# Patient Record
Sex: Female | Born: 1960 | Race: White | Hispanic: No | Smoking: Current every day smoker
Health system: Southern US, Community
[De-identification: ages and names within clinical notes are randomized; demographics above are authoritative.]

## PROBLEM LIST (undated history)

## (undated) DIAGNOSIS — Z720 Tobacco use: Secondary | ICD-10-CM

## (undated) DIAGNOSIS — F32A Depression, unspecified: Secondary | ICD-10-CM

## (undated) DIAGNOSIS — G4700 Insomnia, unspecified: Secondary | ICD-10-CM

## (undated) DIAGNOSIS — Z8639 Personal history of other endocrine, nutritional and metabolic disease: Secondary | ICD-10-CM

## (undated) DIAGNOSIS — I2510 Atherosclerotic heart disease of native coronary artery without angina pectoris: Secondary | ICD-10-CM

## (undated) DIAGNOSIS — F329 Major depressive disorder, single episode, unspecified: Secondary | ICD-10-CM

## (undated) DIAGNOSIS — D696 Thrombocytopenia, unspecified: Secondary | ICD-10-CM

## (undated) DIAGNOSIS — E785 Hyperlipidemia, unspecified: Secondary | ICD-10-CM

## (undated) DIAGNOSIS — I5181 Takotsubo syndrome: Secondary | ICD-10-CM

## (undated) DIAGNOSIS — D751 Secondary polycythemia: Secondary | ICD-10-CM

## (undated) DIAGNOSIS — E669 Obesity, unspecified: Secondary | ICD-10-CM

## (undated) DIAGNOSIS — F4000 Agoraphobia, unspecified: Secondary | ICD-10-CM

## (undated) DIAGNOSIS — I10 Essential (primary) hypertension: Secondary | ICD-10-CM

## (undated) DIAGNOSIS — E876 Hypokalemia: Secondary | ICD-10-CM

## (undated) DIAGNOSIS — F419 Anxiety disorder, unspecified: Secondary | ICD-10-CM

## (undated) DIAGNOSIS — I639 Cerebral infarction, unspecified: Secondary | ICD-10-CM

## (undated) HISTORY — DX: Essential (primary) hypertension: I10

## (undated) HISTORY — DX: Atherosclerotic heart disease of native coronary artery without angina pectoris: I25.10

## (undated) HISTORY — DX: Takotsubo syndrome: I51.81

## (undated) HISTORY — DX: Hyperlipidemia, unspecified: E78.5

## (undated) HISTORY — DX: Anxiety disorder, unspecified: F41.9

## (undated) HISTORY — DX: Secondary polycythemia: D75.1

## (undated) HISTORY — DX: Obesity, unspecified: E66.9

## (undated) HISTORY — DX: Major depressive disorder, single episode, unspecified: F32.9

## (undated) HISTORY — DX: Cerebral infarction, unspecified: I63.9

## (undated) HISTORY — DX: Hypokalemia: E87.6

## (undated) HISTORY — DX: Personal history of other endocrine, nutritional and metabolic disease: Z86.39

## (undated) HISTORY — DX: Insomnia, unspecified: G47.00

## (undated) HISTORY — DX: Tobacco use: Z72.0

## (undated) HISTORY — DX: Agoraphobia, unspecified: F40.00

## (undated) HISTORY — DX: Thrombocytopenia, unspecified: D69.6

## (undated) HISTORY — DX: Depression, unspecified: F32.A

---

## 2012-01-04 ENCOUNTER — Emergency Department: Payer: Self-pay | Admitting: Emergency Medicine

## 2012-03-19 DIAGNOSIS — I639 Cerebral infarction, unspecified: Secondary | ICD-10-CM

## 2012-03-19 HISTORY — DX: Cerebral infarction, unspecified: I63.9

## 2012-04-03 ENCOUNTER — Inpatient Hospital Stay: Payer: Self-pay | Admitting: Internal Medicine

## 2012-04-03 LAB — CBC
HCT: 57.3 % — ABNORMAL HIGH (ref 35.0–47.0)
HGB: 19.8 g/dL — ABNORMAL HIGH (ref 12.0–16.0)
MCH: 32.5 pg (ref 26.0–34.0)
MCV: 94 fL (ref 80–100)
Platelet: 169 10*3/uL (ref 150–440)
RBC: 6.11 10*6/uL — ABNORMAL HIGH (ref 3.80–5.20)
RDW: 13.7 % (ref 11.5–14.5)

## 2012-04-03 LAB — TROPONIN I
Troponin-I: <0.02 ng/mL
Troponin-I: 0.06 ng/mL — ABNORMAL HIGH

## 2012-04-03 LAB — COMPREHENSIVE METABOLIC PANEL
Calcium, Total: 10 mg/dL (ref 8.5–10.1)
Chloride: 101 mmol/L (ref 98–107)
Co2: 24 mmol/L (ref 21–32)
Creatinine: 0.89 mg/dL (ref 0.60–1.30)
EGFR (African American): >60
EGFR (Non-African Amer.): >60
Osmolality: 288 (ref 275–301)
Potassium: 2.8 mmol/L — ABNORMAL LOW (ref 3.5–5.1)
SGPT (ALT): 17 U/L
Total Protein: 8.3 g/dL — ABNORMAL HIGH (ref 6.4–8.2)

## 2012-04-03 LAB — CK TOTAL AND CKMB (NOT AT ARMC)
CK, Total: 250 U/L — ABNORMAL HIGH (ref 21–215)
CK, Total: 742 U/L — ABNORMAL HIGH (ref 21–215)
CK-MB: 5.4 ng/mL — ABNORMAL HIGH (ref 0.5–3.6)

## 2012-04-03 LAB — HCG, QUANTITATIVE, PREGNANCY: Beta Hcg, Quant.: 1 m[IU]/mL

## 2012-04-04 LAB — BASIC METABOLIC PANEL
Anion Gap: 10 (ref 7–16)
BUN: 29 mg/dL — ABNORMAL HIGH (ref 7–18)
Calcium, Total: 9.4 mg/dL (ref 8.5–10.1)
Chloride: 99 mmol/L (ref 98–107)
Creatinine: 0.81 mg/dL (ref 0.60–1.30)
EGFR (Non-African Amer.): >60
Glucose: 145 mg/dL — ABNORMAL HIGH (ref 65–99)
Osmolality: 286 (ref 275–301)
Potassium: 2.8 mmol/L — ABNORMAL LOW (ref 3.5–5.1)

## 2012-04-04 LAB — TROPONIN I: Troponin-I: 0.04 ng/mL

## 2012-04-04 LAB — CBC WITH DIFFERENTIAL/PLATELET
Basophil %: 0.1 %
Eosinophil #: 0 10*3/uL (ref 0.0–0.7)
Eosinophil %: 0 %
HCT: 51.1 % — ABNORMAL HIGH (ref 35.0–47.0)
Lymphocyte #: 0.8 10*3/uL — ABNORMAL LOW (ref 1.0–3.6)
Lymphocyte %: 3.5 %
MCHC: 33.7 g/dL (ref 32.0–36.0)
Monocyte %: 5.3 %
Neutrophil %: 91.1 %
RBC: 5.38 10*6/uL — ABNORMAL HIGH (ref 3.80–5.20)
RDW: 14.1 % (ref 11.5–14.5)
WBC: 23.8 10*3/uL — ABNORMAL HIGH (ref 3.6–11.0)

## 2012-04-04 LAB — URINALYSIS, COMPLETE
Bacteria: NONE SEEN
Leukocyte Esterase: NEGATIVE
Nitrite: NEGATIVE
Ph: 7 (ref 4.5–8.0)
Protein: >=500
RBC,UR: 6 /HPF (ref 0–5)
Specific Gravity: >1.06 (ref 1.003–1.030)
WBC UR: 7 /HPF (ref 0–5)

## 2012-04-04 LAB — CK TOTAL AND CKMB (NOT AT ARMC): CK-MB: 4.8 ng/mL — ABNORMAL HIGH (ref 0.5–3.6)

## 2012-04-04 LAB — DRUG SCREEN, URINE
Amphetamines, Ur Screen: NEGATIVE (ref ?–1000)
Barbiturates, Ur Screen: NEGATIVE (ref ?–200)
Benzodiazepine, Ur Scrn: POSITIVE (ref ?–200)
Cannabinoid 50 Ng, Ur ~~LOC~~: NEGATIVE (ref ?–50)
Phencyclidine (PCP) Ur S: NEGATIVE (ref ?–25)
Tricyclic, Ur Screen: NEGATIVE (ref ?–1000)

## 2012-04-04 LAB — PREGNANCY, URINE: Pregnancy Test, Urine: NEGATIVE m[IU]/mL

## 2012-04-05 LAB — HEMOGLOBIN A1C: Hemoglobin A1C: 5 % (ref 4.2–6.3)

## 2012-04-05 LAB — BASIC METABOLIC PANEL
Anion Gap: 8 (ref 7–16)
BUN: 27 mg/dL — ABNORMAL HIGH (ref 7–18)
Calcium, Total: 9 mg/dL (ref 8.5–10.1)
Creatinine: 0.99 mg/dL (ref 0.60–1.30)
Glucose: 93 mg/dL (ref 65–99)
Osmolality: 282 (ref 275–301)

## 2012-04-05 LAB — CBC WITH DIFFERENTIAL/PLATELET
Basophil #: 0 10*3/uL (ref 0.0–0.1)
Basophil %: 0.3 %
Eosinophil #: 0 10*3/uL (ref 0.0–0.7)
HGB: 16.1 g/dL — ABNORMAL HIGH (ref 12.0–16.0)
Lymphocyte #: 1.2 10*3/uL (ref 1.0–3.6)
Lymphocyte %: 11.3 %
MCH: 32.1 pg (ref 26.0–34.0)
MCV: 98 fL (ref 80–100)
Neutrophil #: 8.4 10*3/uL — ABNORMAL HIGH (ref 1.4–6.5)
RBC: 5 10*6/uL (ref 3.80–5.20)
RDW: 14.2 % (ref 11.5–14.5)
WBC: 10.3 10*3/uL (ref 3.6–11.0)

## 2012-04-05 LAB — MAGNESIUM: Magnesium: 2.2 mg/dL

## 2012-04-06 ENCOUNTER — Observation Stay: Payer: Self-pay | Admitting: Internal Medicine

## 2012-04-06 LAB — SEDIMENTATION RATE: Erythrocyte Sed Rate: 1 mm/hr (ref 0–20)

## 2012-04-06 LAB — CBC
HCT: 49.7 % — ABNORMAL HIGH (ref 35.0–47.0)
MCH: 31.8 pg (ref 26.0–34.0)
MCV: 98 fL (ref 80–100)
Platelet: 145 10*3/uL — ABNORMAL LOW (ref 150–440)
RDW: 13.8 % (ref 11.5–14.5)
WBC: 12.4 10*3/uL — ABNORMAL HIGH (ref 3.6–11.0)

## 2012-04-06 LAB — COMPREHENSIVE METABOLIC PANEL
Alkaline Phosphatase: 68 U/L (ref 50–136)
Anion Gap: 13 (ref 7–16)
Bilirubin,Total: 0.8 mg/dL (ref 0.2–1.0)
Chloride: 103 mmol/L (ref 98–107)
EGFR (African American): >60
Glucose: 95 mg/dL (ref 65–99)
Potassium: 3.7 mmol/L (ref 3.5–5.1)
SGPT (ALT): 24 U/L
Sodium: 139 mmol/L (ref 136–145)

## 2012-04-06 LAB — TROPONIN I: Troponin-I: <0.02 ng/mL

## 2012-04-07 LAB — URINALYSIS, COMPLETE
Blood: NEGATIVE
Leukocyte Esterase: NEGATIVE
Ph: 6 (ref 4.5–8.0)
Protein: NEGATIVE
Specific Gravity: 1.015 (ref 1.003–1.030)
Squamous Epithelial: 3

## 2012-04-07 LAB — CBC WITH DIFFERENTIAL/PLATELET
Basophil #: 0 10*3/uL (ref 0.0–0.1)
Basophil %: 0.5 %
HGB: 15.6 g/dL (ref 12.0–16.0)
Lymphocyte #: 1.6 10*3/uL (ref 1.0–3.6)
Lymphocyte %: 22.9 %
Monocyte #: 0.4 x10 3/mm (ref 0.2–0.9)
Monocyte %: 6 %
Neutrophil #: 4.9 10*3/uL (ref 1.4–6.5)
Platelet: 101 10*3/uL — ABNORMAL LOW (ref 150–440)
RBC: 4.75 10*6/uL (ref 3.80–5.20)
RDW: 13.7 % (ref 11.5–14.5)
WBC: 7.1 10*3/uL (ref 3.6–11.0)

## 2012-04-08 LAB — SEDIMENTATION RATE: Erythrocyte Sed Rate: 1 mm/hr (ref 0–20)

## 2012-05-04 LAB — COMPREHENSIVE METABOLIC PANEL
Anion Gap: 14 (ref 7–16)
Bilirubin,Total: 0.8 mg/dL (ref 0.2–1.0)
Calcium, Total: 9.5 mg/dL (ref 8.5–10.1)
Chloride: 106 mmol/L (ref 98–107)
Co2: 21 mmol/L (ref 21–32)
EGFR (African American): >60
EGFR (Non-African Amer.): >60
Glucose: 131 mg/dL — ABNORMAL HIGH (ref 65–99)
Osmolality: 288 (ref 275–301)
SGOT(AST): 25 U/L (ref 15–37)
SGPT (ALT): 18 U/L
Sodium: 141 mmol/L (ref 136–145)
Total Protein: 7.6 g/dL (ref 6.4–8.2)

## 2012-05-04 LAB — PREGNANCY, URINE: Pregnancy Test, Urine: NEGATIVE m[IU]/mL

## 2012-05-04 LAB — URINALYSIS, COMPLETE
Glucose,UR: NEGATIVE mg/dL (ref 0–75)
Protein: NEGATIVE
RBC,UR: 5 /HPF (ref 0–5)
WBC UR: 4 /HPF (ref 0–5)

## 2012-05-04 LAB — CBC
HCT: 53.7 % — ABNORMAL HIGH (ref 35.0–47.0)
MCHC: 32.3 g/dL (ref 32.0–36.0)
RBC: 5.47 10*6/uL — ABNORMAL HIGH (ref 3.80–5.20)
RDW: 13.2 % (ref 11.5–14.5)
WBC: 11.9 10*3/uL — ABNORMAL HIGH (ref 3.6–11.0)

## 2012-05-04 LAB — LIPASE, BLOOD: Lipase: 171 U/L (ref 73–393)

## 2012-05-05 ENCOUNTER — Inpatient Hospital Stay: Payer: Self-pay | Admitting: Internal Medicine

## 2012-05-05 LAB — TROPONIN I: Troponin-I: <0.02 ng/mL

## 2012-05-05 LAB — TSH: Thyroid Stimulating Horm: 1.24 u[IU]/mL

## 2012-05-05 LAB — CK TOTAL AND CKMB (NOT AT ARMC)
CK, Total: 265 U/L — ABNORMAL HIGH (ref 21–215)
CK, Total: 344 U/L — ABNORMAL HIGH (ref 21–215)
CK-MB: 5.4 ng/mL — ABNORMAL HIGH (ref 0.5–3.6)

## 2012-05-06 LAB — CBC WITH DIFFERENTIAL/PLATELET
Basophil #: 0 10*3/uL (ref 0.0–0.1)
Eosinophil #: 0.1 10*3/uL (ref 0.0–0.7)
HGB: 15.8 g/dL (ref 12.0–16.0)
Lymphocyte #: 1.1 10*3/uL (ref 1.0–3.6)
MCH: 32.6 pg (ref 26.0–34.0)
MCHC: 33.9 g/dL (ref 32.0–36.0)
Monocyte #: 0.5 x10 3/mm (ref 0.2–0.9)
Neutrophil %: 76.4 %
Platelet: 132 10*3/uL — ABNORMAL LOW (ref 150–440)
RDW: 13.3 % (ref 11.5–14.5)

## 2012-05-06 LAB — BASIC METABOLIC PANEL
Calcium, Total: 9 mg/dL (ref 8.5–10.1)
Co2: 24 mmol/L (ref 21–32)
EGFR (African American): >60
Potassium: 3.1 mmol/L — ABNORMAL LOW (ref 3.5–5.1)
Sodium: 138 mmol/L (ref 136–145)

## 2012-05-11 ENCOUNTER — Emergency Department: Payer: Self-pay | Admitting: Emergency Medicine

## 2012-05-11 LAB — BASIC METABOLIC PANEL
Anion Gap: 9 (ref 7–16)
BUN: 6 mg/dL — ABNORMAL LOW (ref 7–18)
Calcium, Total: 9.6 mg/dL (ref 8.5–10.1)
Chloride: 101 mmol/L (ref 98–107)
Co2: 24 mmol/L (ref 21–32)
EGFR (African American): 34 — ABNORMAL LOW
EGFR (Non-African Amer.): 30 — ABNORMAL LOW
Glucose: 90 mg/dL (ref 65–99)
Osmolality: 265 (ref 275–301)
Potassium: 3.4 mmol/L — ABNORMAL LOW (ref 3.5–5.1)

## 2012-05-11 LAB — CBC WITH DIFFERENTIAL/PLATELET
Basophil #: 0.1 10*3/uL (ref 0.0–0.1)
Eosinophil #: 0.1 10*3/uL (ref 0.0–0.7)
Eosinophil %: 0.6 %
HGB: 15.5 g/dL (ref 12.0–16.0)
Lymphocyte #: 1.9 10*3/uL (ref 1.0–3.6)
MCV: 95 fL (ref 80–100)
Monocyte %: 5.5 %
Neutrophil #: 7.3 10*3/uL — ABNORMAL HIGH (ref 1.4–6.5)
Neutrophil %: 74.4 %
Platelet: 173 10*3/uL (ref 150–440)
RBC: 4.82 10*6/uL (ref 3.80–5.20)
RDW: 12.7 % (ref 11.5–14.5)
WBC: 9.9 10*3/uL (ref 3.6–11.0)

## 2012-07-26 ENCOUNTER — Emergency Department: Payer: Self-pay | Admitting: Emergency Medicine

## 2012-07-26 LAB — COMPREHENSIVE METABOLIC PANEL
Albumin: 4.1 g/dL (ref 3.4–5.0)
Bilirubin,Total: 0.9 mg/dL (ref 0.2–1.0)
Calcium, Total: 9.4 mg/dL (ref 8.5–10.1)
Co2: 23 mmol/L (ref 21–32)
Creatinine: 0.84 mg/dL (ref 0.60–1.30)
EGFR (Non-African Amer.): >60
Glucose: 141 mg/dL — ABNORMAL HIGH (ref 65–99)
Osmolality: 283 (ref 275–301)
Potassium: 3.3 mmol/L — ABNORMAL LOW (ref 3.5–5.1)
SGOT(AST): 21 U/L (ref 15–37)
Sodium: 141 mmol/L (ref 136–145)

## 2012-07-26 LAB — URINALYSIS, COMPLETE
Bilirubin,UR: NEGATIVE
Ph: 7 (ref 4.5–8.0)
Protein: >=500
RBC,UR: 20 /HPF (ref 0–5)
Specific Gravity: 1.018 (ref 1.003–1.030)
WBC UR: 8 /HPF (ref 0–5)

## 2012-07-26 LAB — CBC
HCT: 52.4 % — ABNORMAL HIGH (ref 35.0–47.0)
HGB: 18.3 g/dL — ABNORMAL HIGH (ref 12.0–16.0)
MCHC: 35 g/dL (ref 32.0–36.0)
RBC: 5.54 10*6/uL — ABNORMAL HIGH (ref 3.80–5.20)
WBC: 13.1 10*3/uL — ABNORMAL HIGH (ref 3.6–11.0)

## 2012-07-26 LAB — LIPASE, BLOOD: Lipase: 199 U/L (ref 73–393)

## 2012-09-18 HISTORY — PX: CARDIAC CATHETERIZATION: SHX172

## 2012-09-21 ENCOUNTER — Inpatient Hospital Stay: Payer: Self-pay | Admitting: Internal Medicine

## 2012-09-21 LAB — CBC WITH DIFFERENTIAL/PLATELET
Basophil #: 0.5 10*3/uL — ABNORMAL HIGH (ref 0.0–0.1)
Basophil %: 0.3 %
Eosinophil %: 1.4 %
Eosinophil %: 0.1 %
HCT: 51.7 % — ABNORMAL HIGH (ref 35.0–47.0)
HGB: 17.8 g/dL — ABNORMAL HIGH (ref 12.0–16.0)
Lymphocyte #: 0.8 10*3/uL — ABNORMAL LOW (ref 1.0–3.6)
Lymphocyte #: 1 10*3/uL (ref 1.0–3.6)
MCH: 32.4 pg (ref 26.0–34.0)
MCH: 32.6 pg (ref 26.0–34.0)
MCHC: 34.4 g/dL (ref 32.0–36.0)
MCV: 94 fL (ref 80–100)
MCV: 95 fL (ref 80–100)
Monocyte #: 0.5 x10 3/mm (ref 0.2–0.9)
Monocyte #: 0.6 x10 3/mm (ref 0.2–0.9)
Neutrophil #: 9.7 10*3/uL — ABNORMAL HIGH (ref 1.4–6.5)
Neutrophil #: 11.8 10*3/uL — ABNORMAL HIGH (ref 1.4–6.5)
Neutrophil %: 84.1 %
Neutrophil %: 88 %
Platelet: 206 10*3/uL (ref 150–440)
RBC: 5.09 10*6/uL (ref 3.80–5.20)
RBC: 5.45 10*6/uL — ABNORMAL HIGH (ref 3.80–5.20)
RDW: 13.4 % (ref 11.5–14.5)
WBC: 11.5 10*3/uL — ABNORMAL HIGH (ref 3.6–11.0)
WBC: 13.4 10*3/uL — ABNORMAL HIGH (ref 3.6–11.0)

## 2012-09-21 LAB — DRUG SCREEN, URINE
Barbiturates, Ur Screen: NEGATIVE (ref ?–200)
Cocaine Metabolite,Ur ~~LOC~~: NEGATIVE (ref ?–300)
MDMA (Ecstasy)Ur Screen: NEGATIVE (ref ?–500)
Opiate, Ur Screen: POSITIVE (ref ?–300)
Phencyclidine (PCP) Ur S: NEGATIVE (ref ?–25)
Tricyclic, Ur Screen: NEGATIVE (ref ?–1000)

## 2012-09-21 LAB — URINALYSIS, COMPLETE
Bacteria: NONE SEEN
Bilirubin,UR: NEGATIVE
Blood: NEGATIVE
Glucose,UR: 50 mg/dL (ref 0–75)
Leukocyte Esterase: NEGATIVE
Ph: 7 (ref 4.5–8.0)
Specific Gravity: 1.015 (ref 1.003–1.030)
WBC UR: 1 /HPF (ref 0–5)

## 2012-09-21 LAB — CK-MB: CK-MB: 19.6 ng/mL — ABNORMAL HIGH (ref 0.5–3.6)

## 2012-09-21 LAB — CK TOTAL AND CKMB (NOT AT ARMC)
CK, Total: 422 U/L — ABNORMAL HIGH (ref 21–215)
CK-MB: 36.7 ng/mL — ABNORMAL HIGH (ref 0.5–3.6)

## 2012-09-21 LAB — COMPREHENSIVE METABOLIC PANEL
Albumin: 3.9 g/dL (ref 3.4–5.0)
Alkaline Phosphatase: 75 U/L (ref 50–136)
Calcium, Total: 9.5 mg/dL (ref 8.5–10.1)
Co2: 20 mmol/L — ABNORMAL LOW (ref 21–32)
Glucose: 161 mg/dL — ABNORMAL HIGH (ref 65–99)
Potassium: 4.8 mmol/L (ref 3.5–5.1)
SGOT(AST): 37 U/L (ref 15–37)
SGPT (ALT): 17 U/L (ref 12–78)

## 2012-09-21 LAB — PREGNANCY, URINE: Pregnancy Test, Urine: NEGATIVE m[IU]/mL

## 2012-09-21 LAB — APTT: Activated PTT: 31.3 secs (ref 23.6–35.9)

## 2012-09-22 DIAGNOSIS — I517 Cardiomegaly: Secondary | ICD-10-CM

## 2012-09-22 DIAGNOSIS — I2510 Atherosclerotic heart disease of native coronary artery without angina pectoris: Secondary | ICD-10-CM

## 2012-09-22 LAB — CBC WITH DIFFERENTIAL/PLATELET
Basophil %: 0.4 %
Eosinophil %: 0.1 %
HCT: 48.8 % — ABNORMAL HIGH (ref 35.0–47.0)
HGB: 17 g/dL — ABNORMAL HIGH (ref 12.0–16.0)
Lymphocyte #: 1.6 10*3/uL (ref 1.0–3.6)
MCH: 32.9 pg (ref 26.0–34.0)
MCHC: 34.9 g/dL (ref 32.0–36.0)
MCV: 94 fL (ref 80–100)
Monocyte #: 0.8 x10 3/mm (ref 0.2–0.9)
Neutrophil #: 10.6 10*3/uL — ABNORMAL HIGH (ref 1.4–6.5)
Neutrophil %: 81.4 %
Platelet: 230 10*3/uL (ref 150–440)
RBC: 5.17 10*6/uL (ref 3.80–5.20)

## 2012-09-22 LAB — APTT: Activated PTT: 65.1 secs — ABNORMAL HIGH (ref 23.6–35.9)

## 2012-09-22 LAB — COMPREHENSIVE METABOLIC PANEL
Albumin: 3.7 g/dL (ref 3.4–5.0)
Anion Gap: 9 (ref 7–16)
BUN: 23 mg/dL — ABNORMAL HIGH (ref 7–18)
Bilirubin,Total: 0.6 mg/dL (ref 0.2–1.0)
Chloride: 105 mmol/L (ref 98–107)
Creatinine: 0.9 mg/dL (ref 0.60–1.30)
EGFR (African American): >60
EGFR (Non-African Amer.): >60
Glucose: 110 mg/dL — ABNORMAL HIGH (ref 65–99)
Osmolality: 280 (ref 275–301)
Potassium: 3.1 mmol/L — ABNORMAL LOW (ref 3.5–5.1)
SGOT(AST): 50 U/L — ABNORMAL HIGH (ref 15–37)
SGPT (ALT): 18 U/L (ref 12–78)
Total Protein: 7.4 g/dL (ref 6.4–8.2)

## 2012-09-22 LAB — LIPASE, BLOOD: Lipase: 223 U/L (ref 73–393)

## 2012-09-22 LAB — LIPID PANEL
Cholesterol: 211 mg/dL — ABNORMAL HIGH (ref 0–200)
HDL Cholesterol: 61 mg/dL — ABNORMAL HIGH (ref 40–60)
Ldl Cholesterol, Calc: 124 mg/dL — ABNORMAL HIGH (ref 0–100)

## 2012-09-22 LAB — CK TOTAL AND CKMB (NOT AT ARMC)
CK, Total: 348 U/L — ABNORMAL HIGH (ref 21–215)
CK-MB: 25.7 ng/mL — ABNORMAL HIGH (ref 0.5–3.6)

## 2012-09-22 LAB — MAGNESIUM: Magnesium: 2.3 mg/dL

## 2012-09-23 ENCOUNTER — Telehealth: Payer: Self-pay

## 2012-09-23 ENCOUNTER — Encounter: Payer: Self-pay | Admitting: *Deleted

## 2012-09-23 ENCOUNTER — Other Ambulatory Visit: Payer: Self-pay | Admitting: Cardiovascular Disease

## 2012-09-23 DIAGNOSIS — I214 Non-ST elevation (NSTEMI) myocardial infarction: Secondary | ICD-10-CM

## 2012-09-23 NOTE — Telephone Encounter (Signed)
Message copied by [facility], Tremayne Sheldon E on Fri Sep 23, 2012  2:00 PM ------      Message from: Kendrick Fries      Created: Fri Sep 23, 2012 11:40 AM       Tcm      Discharged from [facility] 09/23/2012.      F/u with [facility] 09/29/12.

## 2012-09-26 NOTE — Telephone Encounter (Signed)
TCM attempt #1 "number you are trying to call is not reachable"

## 2012-09-27 NOTE — Telephone Encounter (Signed)
TCM attempt #2 "not reachable"

## 2012-09-29 ENCOUNTER — Encounter: Payer: Self-pay | Admitting: Cardiovascular Disease

## 2012-10-02 ENCOUNTER — Emergency Department: Payer: Self-pay | Admitting: Emergency Medicine

## 2012-10-02 LAB — COMPREHENSIVE METABOLIC PANEL
Albumin: 4.5 g/dL (ref 3.4–5.0)
Alkaline Phosphatase: 84 U/L (ref 50–136)
BUN: 34 mg/dL — ABNORMAL HIGH (ref 7–18)
Bilirubin,Total: 0.6 mg/dL (ref 0.2–1.0)
Co2: 23 mmol/L (ref 21–32)
Creatinine: 1.21 mg/dL (ref 0.60–1.30)
EGFR (Non-African Amer.): 52 — ABNORMAL LOW
Glucose: 162 mg/dL — ABNORMAL HIGH (ref 65–99)
SGPT (ALT): 25 U/L (ref 12–78)
Total Protein: 8.5 g/dL — ABNORMAL HIGH (ref 6.4–8.2)

## 2012-10-02 LAB — URINALYSIS, COMPLETE
Bacteria: NONE SEEN
Glucose,UR: 50 mg/dL (ref 0–75)
Hyaline Cast: 3
Nitrite: NEGATIVE
Specific Gravity: 1.022 (ref 1.003–1.030)
WBC UR: 2 /HPF (ref 0–5)

## 2012-10-02 LAB — TROPONIN I
Troponin-I: 0.21 ng/mL — ABNORMAL HIGH
Troponin-I: 0.37 ng/mL — ABNORMAL HIGH

## 2012-10-02 LAB — CBC
HCT: 56.2 % — ABNORMAL HIGH
HGB: 19.3 g/dL — ABNORMAL HIGH
MCH: 32.3 pg
MCHC: 34.3 g/dL
MCV: 94 fL
Platelet: 272 10*3/uL
RBC: 5.98 X10 6/mm 3 — ABNORMAL HIGH
RDW: 13.6 %
WBC: 17.7 10*3/uL — ABNORMAL HIGH

## 2012-10-21 ENCOUNTER — Inpatient Hospital Stay: Payer: Self-pay | Admitting: Family Medicine

## 2012-10-21 LAB — DRUG SCREEN, URINE
Amphetamines, Ur Screen: NEGATIVE (ref ?–1000)
Barbiturates, Ur Screen: NEGATIVE (ref ?–200)
Cannabinoid 50 Ng, Ur ~~LOC~~: NEGATIVE (ref ?–50)
Cocaine Metabolite,Ur ~~LOC~~: NEGATIVE (ref ?–300)
MDMA (Ecstasy)Ur Screen: NEGATIVE (ref ?–500)
Opiate, Ur Screen: NEGATIVE (ref ?–300)
Phencyclidine (PCP) Ur S: NEGATIVE (ref ?–25)
Tricyclic, Ur Screen: NEGATIVE (ref ?–1000)

## 2012-10-21 LAB — COMPREHENSIVE METABOLIC PANEL
Albumin: 4.7 g/dL (ref 3.4–5.0)
Anion Gap: 17 — ABNORMAL HIGH (ref 7–16)
BUN: 29 mg/dL — ABNORMAL HIGH (ref 7–18)
Bilirubin,Total: 1.1 mg/dL — ABNORMAL HIGH (ref 0.2–1.0)
Co2: 26 mmol/L (ref 21–32)
Creatinine: 1.65 mg/dL — ABNORMAL HIGH (ref 0.60–1.30)
Glucose: 176 mg/dL — ABNORMAL HIGH (ref 65–99)
Osmolality: 290 (ref 275–301)
Potassium: 3 mmol/L — ABNORMAL LOW (ref 3.5–5.1)
SGOT(AST): 46 U/L — ABNORMAL HIGH (ref 15–37)
SGPT (ALT): 19 U/L (ref 12–78)
Total Protein: 9 g/dL — ABNORMAL HIGH (ref 6.4–8.2)

## 2012-10-21 LAB — CBC
HCT: 58.2 % — ABNORMAL HIGH (ref 35.0–47.0)
HGB: 20.3 g/dL — ABNORMAL HIGH (ref 12.0–16.0)
MCHC: 34.8 g/dL (ref 32.0–36.0)
MCV: 93 fL (ref 80–100)
Platelet: 253 10*3/uL (ref 150–440)
RBC: 6.29 10*6/uL — ABNORMAL HIGH (ref 3.80–5.20)
RDW: 13.6 % (ref 11.5–14.5)
WBC: 25.8 10*3/uL — ABNORMAL HIGH (ref 3.6–11.0)

## 2012-10-21 LAB — LIPASE, BLOOD: Lipase: 179 U/L (ref 73–393)

## 2012-10-21 LAB — URINALYSIS, COMPLETE
Glucose,UR: 50 mg/dL (ref 0–75)
Nitrite: NEGATIVE
Protein: >=500
RBC,UR: 5 /HPF (ref 0–5)
Specific Gravity: 1.032 (ref 1.003–1.030)
Squamous Epithelial: 8

## 2012-10-21 LAB — RAPID INFLUENZA A&B ANTIGENS

## 2012-10-21 LAB — TROPONIN I: Troponin-I: 8.09 ng/mL — ABNORMAL HIGH

## 2012-10-21 LAB — ETHANOL
Ethanol %: <0.003 % (ref 0.000–0.080)
Ethanol: <3 mg/dL

## 2012-10-21 LAB — MAGNESIUM: Magnesium: 2 mg/dL

## 2012-10-21 LAB — APTT: Activated PTT: 28.1 secs (ref 23.6–35.9)

## 2012-10-21 LAB — PROTIME-INR: Prothrombin Time: 12 secs (ref 11.5–14.7)

## 2012-10-21 LAB — CK TOTAL AND CKMB (NOT AT ARMC): CK-MB: 22.8 ng/mL — ABNORMAL HIGH (ref 0.5–3.6)

## 2012-10-22 DIAGNOSIS — I214 Non-ST elevation (NSTEMI) myocardial infarction: Secondary | ICD-10-CM

## 2012-10-22 LAB — CBC WITH DIFFERENTIAL/PLATELET
Basophil #: 0.1 10*3/uL (ref 0.0–0.1)
Basophil %: 0.6 %
Eosinophil %: 0 %
HCT: 46.4 % (ref 35.0–47.0)
HGB: 16.2 g/dL — ABNORMAL HIGH (ref 12.0–16.0)
Lymphocyte #: 1.6 10*3/uL (ref 1.0–3.6)
Lymphocyte %: 10.1 %
MCH: 32.3 pg (ref 26.0–34.0)
MCHC: 34.9 g/dL (ref 32.0–36.0)
Monocyte #: 1.1 x10 3/mm — ABNORMAL HIGH (ref 0.2–0.9)
Neutrophil #: 13.4 10*3/uL — ABNORMAL HIGH (ref 1.4–6.5)
Neutrophil %: 82.6 %
Platelet: 163 10*3/uL (ref 150–440)
RBC: 5.01 10*6/uL (ref 3.80–5.20)
RDW: 13.6 % (ref 11.5–14.5)
WBC: 16.2 10*3/uL — ABNORMAL HIGH (ref 3.6–11.0)

## 2012-10-22 LAB — BASIC METABOLIC PANEL
Anion Gap: 10 (ref 7–16)
BUN: 33 mg/dL — ABNORMAL HIGH (ref 7–18)
Calcium, Total: 8.7 mg/dL (ref 8.5–10.1)
Chloride: 104 mmol/L (ref 98–107)
Co2: 30 mmol/L (ref 21–32)
EGFR (African American): 51 — ABNORMAL LOW
EGFR (Non-African Amer.): 44 — ABNORMAL LOW
Glucose: 116 mg/dL — ABNORMAL HIGH (ref 65–99)
Osmolality: 295 (ref 275–301)
Potassium: 2.7 mmol/L — ABNORMAL LOW (ref 3.5–5.1)

## 2012-10-22 LAB — APTT: Activated PTT: 104.3 secs — ABNORMAL HIGH (ref 23.6–35.9)

## 2012-10-22 LAB — TROPONIN I: Troponin-I: 5.3 ng/mL — ABNORMAL HIGH

## 2012-10-22 LAB — POTASSIUM: Potassium: 3.2 mmol/L — ABNORMAL LOW (ref 3.5–5.1)

## 2012-10-23 DIAGNOSIS — I517 Cardiomegaly: Secondary | ICD-10-CM

## 2012-10-23 LAB — TROPONIN I: Troponin-I: 0.82 ng/mL — ABNORMAL HIGH

## 2012-10-23 LAB — CBC WITH DIFFERENTIAL/PLATELET
Eosinophil #: 0 10*3/uL (ref 0.0–0.7)
Eosinophil %: 0.3 %
HCT: 42 % (ref 35.0–47.0)
HGB: 14.3 g/dL (ref 12.0–16.0)
Lymphocyte #: 2 10*3/uL (ref 1.0–3.6)
Lymphocyte %: 19.3 %
MCV: 95 fL (ref 80–100)
Monocyte #: 0.5 x10 3/mm (ref 0.2–0.9)
Monocyte %: 5.3 %
Neutrophil %: 74 %
RBC: 4.44 10*6/uL (ref 3.80–5.20)
RDW: 13.5 % (ref 11.5–14.5)

## 2012-10-23 LAB — BASIC METABOLIC PANEL
Anion Gap: 9 (ref 7–16)
BUN: 21 mg/dL — ABNORMAL HIGH (ref 7–18)
Creatinine: 1.06 mg/dL (ref 0.60–1.30)
EGFR (Non-African Amer.): >60
Glucose: 89 mg/dL (ref 65–99)
Osmolality: 289 (ref 275–301)
Potassium: 3.1 mmol/L — ABNORMAL LOW (ref 3.5–5.1)
Sodium: 144 mmol/L (ref 136–145)

## 2012-10-23 LAB — APTT: Activated PTT: 80.3 secs — ABNORMAL HIGH (ref 23.6–35.9)

## 2012-10-24 DIAGNOSIS — I214 Non-ST elevation (NSTEMI) myocardial infarction: Secondary | ICD-10-CM

## 2012-10-24 LAB — CBC WITH DIFFERENTIAL/PLATELET
Basophil #: 0.1 10*3/uL (ref 0.0–0.1)
Basophil %: 0.5 %
Eosinophil #: 0 10*3/uL (ref 0.0–0.7)
HCT: 44.6 % (ref 35.0–47.0)
HGB: 15 g/dL (ref 12.0–16.0)
Lymphocyte #: 1.3 10*3/uL (ref 1.0–3.6)
Lymphocyte %: 10.5 %
MCH: 31.8 pg (ref 26.0–34.0)
MCHC: 33.5 g/dL (ref 32.0–36.0)
Monocyte #: 0.7 x10 3/mm (ref 0.2–0.9)
Monocyte %: 5.9 %
Neutrophil #: 10.4 10*3/uL — ABNORMAL HIGH (ref 1.4–6.5)
Neutrophil %: 82.8 %
RBC: 4.7 10*6/uL (ref 3.80–5.20)
RDW: 13.5 % (ref 11.5–14.5)
WBC: 12.5 10*3/uL — ABNORMAL HIGH (ref 3.6–11.0)

## 2012-10-24 LAB — APTT
Activated PTT: 61.3 secs — ABNORMAL HIGH (ref 23.6–35.9)
Activated PTT: 34.7 secs (ref 23.6–35.9)

## 2012-10-25 ENCOUNTER — Telehealth: Payer: Self-pay

## 2012-10-25 LAB — CBC WITH DIFFERENTIAL/PLATELET
Basophil #: 0 10*3/uL (ref 0.0–0.1)
Basophil %: 0.6 %
Eosinophil %: 1.8 %
HCT: 41.9 % (ref 35.0–47.0)
Lymphocyte #: 1.6 10*3/uL (ref 1.0–3.6)
Lymphocyte %: 23.1 %
MCH: 32.7 pg (ref 26.0–34.0)
MCV: 95 fL (ref 80–100)
Monocyte #: 0.4 x10 3/mm (ref 0.2–0.9)
Monocyte %: 5 %
Neutrophil #: 4.9 10*3/uL (ref 1.4–6.5)
Neutrophil %: 69.5 %
RBC: 4.42 10*6/uL (ref 3.80–5.20)
WBC: 7 10*3/uL (ref 3.6–11.0)

## 2012-10-25 LAB — BASIC METABOLIC PANEL
Anion Gap: 9 (ref 7–16)
BUN: 14 mg/dL (ref 7–18)
EGFR (African American): >60
EGFR (Non-African Amer.): >60
Osmolality: 282 (ref 275–301)
Potassium: 3.4 mmol/L — ABNORMAL LOW (ref 3.5–5.1)
Sodium: 142 mmol/L (ref 136–145)

## 2012-10-25 NOTE — Telephone Encounter (Signed)
TCM D/C 10/25/12 NSTEMI/stress cardiomyopathy

## 2012-10-26 NOTE — Telephone Encounter (Signed)
TCM attempt #1 Non working #, attempted # listed in [facility] records, also nonworking #

## 2012-10-27 NOTE — Telephone Encounter (Signed)
TCM attempt #2 Non working #

## 2012-10-28 ENCOUNTER — Encounter: Payer: Self-pay | Admitting: *Deleted

## 2012-10-31 ENCOUNTER — Ambulatory Visit: Payer: Medicaid Other | Admitting: Cardiovascular Disease

## 2012-11-02 ENCOUNTER — Ambulatory Visit (INDEPENDENT_AMBULATORY_CARE_PROVIDER_SITE_OTHER): Payer: Medicaid Other | Admitting: Nurse Practitioner

## 2012-11-02 ENCOUNTER — Encounter: Payer: Self-pay | Admitting: Nurse Practitioner

## 2012-11-02 VITALS — BP 126/80 | HR 69 | Ht 67.0 in | Wt 219.5 lb

## 2012-11-02 DIAGNOSIS — F32A Depression, unspecified: Secondary | ICD-10-CM | POA: Insufficient documentation

## 2012-11-02 DIAGNOSIS — I2510 Atherosclerotic heart disease of native coronary artery without angina pectoris: Secondary | ICD-10-CM | POA: Insufficient documentation

## 2012-11-02 DIAGNOSIS — F329 Major depressive disorder, single episode, unspecified: Secondary | ICD-10-CM | POA: Insufficient documentation

## 2012-11-02 DIAGNOSIS — Z720 Tobacco use: Secondary | ICD-10-CM

## 2012-11-02 DIAGNOSIS — E669 Obesity, unspecified: Secondary | ICD-10-CM

## 2012-11-02 DIAGNOSIS — R000 Tachycardia, unspecified: Secondary | ICD-10-CM

## 2012-11-02 DIAGNOSIS — R0602 Shortness of breath: Secondary | ICD-10-CM

## 2012-11-02 DIAGNOSIS — I5181 Takotsubo syndrome: Secondary | ICD-10-CM | POA: Insufficient documentation

## 2012-11-02 DIAGNOSIS — D696 Thrombocytopenia, unspecified: Secondary | ICD-10-CM

## 2012-11-02 DIAGNOSIS — F4000 Agoraphobia, unspecified: Secondary | ICD-10-CM | POA: Insufficient documentation

## 2012-11-02 DIAGNOSIS — I10 Essential (primary) hypertension: Secondary | ICD-10-CM | POA: Insufficient documentation

## 2012-11-02 DIAGNOSIS — E876 Hypokalemia: Secondary | ICD-10-CM

## 2012-11-02 DIAGNOSIS — E785 Hyperlipidemia, unspecified: Secondary | ICD-10-CM | POA: Insufficient documentation

## 2012-11-02 NOTE — Progress Notes (Signed)
Patient Name: [name] Date of Encounter: 11/02/2012  Primary Care Provider:  No primary provider on file. Primary Cardiologist:  M. Kirke Corin, MD  Patient Profile  52 year old female with history of stress-induced cardiomyopathy and malignant hypertension who presents for followup after recent admission.  Problem List   Past Medical History  Diagnosis Date  . Dyslipidemia   . History of hyperkalemia   . Insomnia   . Malignant hypertension   . Polycythemia     chronic; due to smoking  . Anxiety and depression   . CVA (cerebrovascular accident) 03/2012  . Coronary artery disease nonobstructive    a. 09/2012 NSTEMI/Cath: LM nl, LAD 41m, 20d, LCX  20ost, /m, RCA 50p, 58m, EF 35%;  b. 10/2012 NSTEMI in setting of anxiety, n/v, htn emergency, EF 55% by echo-->felt to be stress induced.  . Stress-induced cardiomyopathy     a. 09/2012 EF 35%;  b. 10/2012 Echo: EF >55%, mod conc LVH, nl RV.  Marland Kitchen Hyperlipidemia   . Agoraphobia   . Hypokalemia     a. 10/2012 hospitalization  . Thrombocytopenia     a. 10/2012 hospitalization  . Obesity     a. previously over 400 lbs.  . Tobacco abuse     a. previously smoked 4 ppd.   Past Surgical History  Procedure Date  . Cardiac catheterization 09/2012    [facility]    Allergies  No Known Allergies  HPI  52 year old female with the above problem list. Patient has had multiple omissions to [facility] secondary to marked hypertension in the setting of nausea, vomiting, and anxiety. She was admitted in December of 2013 and found to have elevated troponin. She underwent diagnostic catheterization which showed nonobstructive CAD and an EF of 35%. This was felt to represent a stress-induced cardiomyopathy. She was placed on medical therapy including beta blocker and ACE inhibitor. She never followed up after that admission. Unfortunately, she was readmitted 2 weeks ago with recurrent nausea, vomiting, anxiety, and hypertension. She was unable to keep  her antihypertensives down at home. Troponin was elevated and cardiology was consulted. Antihypertensives were restored with improved blood pressure. Followup 2-D echocardiogram showed normalization of LV function and given nonobstructive disease on catheterization in December, no further ischemic evaluation was pursued. She had hypokalemia throughout her admission and on her last day of admission was found to have thrombocytopenia. Since her discharge, she has been feeling well. She checks her blood pressure at a local pharmacy a few times per week and notes that her systolic pressure has been in the 120s to 130s. She has not had any chest pain. She does experience dyspnea on exertion especially walking longer distances. This is often associated with diaphoresis. Though she was smoking 4 packs a day in the past, she has only smoked one cigarette since her discharge last week and that was earlier today. She feels as though her anxiety is well controlled.  She denies pnd, orthopnea, n, v, dizziness, syncope, edema, weight gain, or early satiety. She has not been weighing herself daily.   Home Medications  Prior to Admission medications   Medication Sig Start Date End Date Taking? Authorizing Provider  amLODipine (NORVASC) 2.5 MG tablet Take 2.5 mg by mouth daily.   Yes Historical Provider, MD  aspirin 81 MG tablet Takes 2 tablets daily.   Yes Historical Provider, MD  atomoxetine (STRATTERA) 80 MG capsule Take 80 mg by mouth daily.   Yes Historical Provider, MD  benazepril-hydrochlorthiazide (LOTENSIN HCT) 20-12.5 MG  per tablet Take 1 tablet by mouth 2 (two) times daily.   Yes Historical Provider, MD  carvedilol (COREG) 12.5 MG tablet Take 12.5 mg by mouth 2 (two) times daily with a meal.   Yes Historical Provider, MD  isosorbide mononitrate (IMDUR) 30 MG 24 hr tablet Take 30 mg by mouth daily.   Yes Historical Provider, MD  lisinopril (PRINIVIL,ZESTRIL) 10 MG tablet Take 10 mg by mouth daily.   Yes  Historical Provider, MD  LORazepam (ATIVAN) 1 MG tablet Take 1 mg by mouth every 8 (eight) hours.   Yes Historical Provider, MD  metoCLOPramide (REGLAN) 5 MG tablet Take 5 mg by mouth 3 (three) times daily.   Yes Historical Provider, MD  pantoprazole (PROTONIX) 40 MG tablet Take 40 mg by mouth 2 (two) times daily.   Yes Historical Provider, MD  potassium chloride SA (K-DUR,KLOR-CON) 20 MEQ tablet Take 20 mEq by mouth daily.   Yes Historical Provider, MD  promethazine (PHENERGAN) 25 MG tablet Take 25 mg by mouth every 6 (six) hours as needed.   Yes Historical Provider, MD  sertraline (ZOLOFT) 50 MG tablet Take 50 mg by mouth daily.   Yes Historical Provider, MD  simvastatin (ZOCOR) 40 MG tablet Take 40 mg by mouth daily.   Yes Historical Provider, MD    Review of Systems  She continues to have doe but denies pnd, orthopnea, n, v, dizziness, syncope, edema, weight gain, or early satiety.  All other systems reviewed and are otherwise negative except as noted above.  Physical Exam  Blood pressure 126/80, pulse 69, height 5\' 7"  (1.702 m), weight 219 lb 8 oz (99.565 kg).  General: Pleasant, NAD Psych: Normal affect. Neuro: Alert and oriented X 3. Moves all extremities spontaneously. HEENT: Normal  Neck: Supple without bruits or JVD. Lungs:  Resp regular and unlabored, CTA. Heart: RRR no s3, s4, or murmurs. Abdomen: Soft, non-tender, non-distended, BS + x 4.  Extremities: No clubbing, cyanosis or edema. DP/PT/Radials 2+ and equal bilaterally.  Accessory Clinical Findings  ECG -regular sinus rhythm, 69, poor R-wave progression, LVH.  Assessment & Plan  1.  Stress-induced myopathy: Patient status post recent readmission secondary to malignant hypertension with nausea, vomiting, anxiety, and troponin elevation. Repeat echocardiogram showed normal LV function. This is an improvement since December readings. She has been compliant with her medications at home and has been checking her blood  pressure a few times per week and has noted that it is in the 120s to 130s systolically. She has not been weighing herself regularly and we discussed the importance of daily weights as well as sodium restriction, medication and lifestyle compliance, and symptom reporting including when to call for weight gain. She has no evidence of volume overload today and her blood pressure is stable. We'll make no changes to her medications. We'll check a CBC and bmet given thrombocytopenia and hypokalemia noted during admission.  2. Hypertension: Stable. He reports good compliance with medications and blood pressures reflect this.  3. Agoraphobia: Patient feels as though this is relatively well controlled. She is followup with primary care tomorrow.  4. Nausea and vomiting: This has not recurred since hospitalization. Patient also has noted occasional diarrhea. She is followup with primary care tomorrow and may require GI referral.  5. Tobacco abuse: Patient had not smoked following discharge but then smoked one cigarette this morning. Complete cessation advised.  6. Obesity: We discussed the importance of calorie awareness and exercise.  7. Disposition: CBC and basic metabolic panel  today. She will followup with Dr. Kirke Corin in 4-6 wks.   Nicolasa Ducking, NP 11/02/2012, 12:58 PM

## 2012-11-02 NOTE — Patient Instructions (Addendum)
Your physician wants you to follow-up in: 6 weeks with Dr. Kirke Corin. You will receive a reminder letter in the mail two months in advance. If you don't receive a letter, please call our office to schedule the follow-up appointment.

## 2012-11-03 LAB — BASIC METABOLIC PANEL
BUN: 14 mg/dL (ref 6–24)
CO2: 24 mmol/L (ref 19–28)
Calcium: 9.7 mg/dL (ref 8.7–10.2)
Creatinine, Ser: 0.95 mg/dL (ref 0.57–1.00)
GFR calc non Af Amer: 71 mL/min/{1.73_m2} (ref >59)
Glucose: 92 mg/dL (ref 65–99)

## 2012-11-03 LAB — CBC WITH DIFFERENTIAL
Basophils Absolute: 0 10*3/uL (ref 0.0–0.2)
Basos: 0 % (ref 0–3)
Eosinophils Absolute: 0.2 10*3/uL (ref 0.0–0.4)
HCT: 43.9 % (ref 34.0–46.6)
Hemoglobin: 14.5 g/dL (ref 11.1–15.9)
Immature Grans (Abs): 0 10*3/uL (ref 0.0–0.1)
Lymphs: 20 % (ref 14–46)
MCHC: 33 g/dL (ref 31.5–35.7)
Monocytes: 5 % (ref 4–12)
Neutrophils Absolute: 7.4 10*3/uL — ABNORMAL HIGH (ref 1.4–7.0)
Neutrophils Relative %: 73 % (ref 40–74)
WBC: 10.3 10*3/uL (ref 3.4–10.8)

## 2012-11-04 ENCOUNTER — Other Ambulatory Visit: Payer: Self-pay

## 2012-11-04 DIAGNOSIS — E875 Hyperkalemia: Secondary | ICD-10-CM

## 2012-11-07 ENCOUNTER — Telehealth: Payer: Self-pay

## 2012-11-07 NOTE — Telephone Encounter (Signed)
Due for labs

## 2012-11-07 NOTE — Telephone Encounter (Signed)
Pt says she will call us tomm to schedule labs for thurs/friday

## 2012-11-11 ENCOUNTER — Other Ambulatory Visit: Payer: Medicaid Other

## 2012-11-15 ENCOUNTER — Ambulatory Visit (INDEPENDENT_AMBULATORY_CARE_PROVIDER_SITE_OTHER): Payer: Medicaid Other

## 2012-11-15 DIAGNOSIS — E875 Hyperkalemia: Secondary | ICD-10-CM

## 2012-11-16 LAB — BASIC METABOLIC PANEL
BUN/Creatinine Ratio: 17 (ref 9–23)
CO2: 23 mmol/L (ref 19–28)
Calcium: 9.5 mg/dL (ref 8.7–10.2)
Chloride: 108 mmol/L (ref 97–108)
Creatinine, Ser: 0.88 mg/dL (ref 0.57–1.00)
Sodium: 143 mmol/L (ref 134–144)

## 2012-11-18 ENCOUNTER — Ambulatory Visit (INDEPENDENT_AMBULATORY_CARE_PROVIDER_SITE_OTHER): Payer: Medicaid Other | Admitting: Nurse Practitioner

## 2012-11-18 VITALS — BP 150/80 | HR 80 | Ht 67.0 in | Wt 219.0 lb

## 2012-11-18 DIAGNOSIS — I10 Essential (primary) hypertension: Secondary | ICD-10-CM

## 2012-11-18 NOTE — Progress Notes (Signed)
Pt here for medication reconsiliation She brought bottles of meds she has been taking MAR is updated She has been taking both coreg 12.5 mg BID and 6.25 mg BID She has continued KCL 20 meq daily despite being told 2 weeks ago to hold for hyperkalemia She has also been taking both lisinopril 10 mg daily and lisinopril/HCT 20/12.5 mg daily She has not been taking benazapril/HCT as we had documented last OV  K=5.0 3 days ago See lab results and Ward Givens, NP documentation  I will instruct pt to hold KCL, stop lisinopril/HCT and stop coreg 6.25 mg   Addendum:  Reviewed changes made earlier today by RN.  Given that patient was still taking potassium supplementation, I suspect that was driving her hyperkalemia more than anything else.  I recommend discontinuation of KCl as above, but wish to continue coreg 18.75mg  bid and lisinopril-hctz 20/12.5 plus lisinopril 10mg  daily.  This regimen was effective in keeping her BP under control upon my last visit with her.  When it is time to refill her coreg, we should provide a Rx for 12.5mg  1.5 tabs bid, unless she wishes to stick with 2 separate doses - taken together.  Unfortunately, there is no 30mg  formulation of lisinopril-hctz.  Nicolasa Ducking, NP

## 2012-11-18 NOTE — Patient Instructions (Addendum)
Your physician has recommended you make the following change in your medication:  -stop potassium (KCL) -stop lisinopril/HCT -stop coreg 6.25 mg   Keep appointment with Dr. Kirke Corin 2/25

## 2012-11-24 ENCOUNTER — Telehealth: Payer: Self-pay

## 2012-11-24 DIAGNOSIS — F4000 Agoraphobia, unspecified: Secondary | ICD-10-CM

## 2012-11-24 DIAGNOSIS — I2510 Atherosclerotic heart disease of native coronary artery without angina pectoris: Secondary | ICD-10-CM

## 2012-11-24 DIAGNOSIS — E785 Hyperlipidemia, unspecified: Secondary | ICD-10-CM

## 2012-11-24 DIAGNOSIS — F17200 Nicotine dependence, unspecified, uncomplicated: Secondary | ICD-10-CM

## 2012-11-24 DIAGNOSIS — R0602 Shortness of breath: Secondary | ICD-10-CM

## 2012-11-24 DIAGNOSIS — D696 Thrombocytopenia, unspecified: Secondary | ICD-10-CM

## 2012-11-24 DIAGNOSIS — E876 Hypokalemia: Secondary | ICD-10-CM

## 2012-11-24 DIAGNOSIS — E669 Obesity, unspecified: Secondary | ICD-10-CM

## 2012-11-24 DIAGNOSIS — I5181 Takotsubo syndrome: Secondary | ICD-10-CM

## 2012-11-24 NOTE — Telephone Encounter (Signed)
I called pt to tell her to resume meds per Gilford Raid instruction at last visit. She tells me she is in PCP office now and will call me back when she is done BP in MD office today is 140/100

## 2012-11-24 NOTE — Telephone Encounter (Signed)
LMTCB

## 2012-11-25 NOTE — Telephone Encounter (Signed)
Attempted to reach pt re: BP No answer

## 2012-11-28 NOTE — Telephone Encounter (Signed)
lmtcb

## 2012-11-30 NOTE — Telephone Encounter (Signed)
lmtcb

## 2012-12-09 ENCOUNTER — Telehealth: Payer: Self-pay | Admitting: Cardiovascular Disease

## 2012-12-09 MED ORDER — PANTOPRAZOLE SODIUM 40 MG PO TBEC: 40.0000 mg | DELAYED_RELEASE_TABLET | Freq: Two times a day (BID) | ORAL | Status: DC

## 2012-12-09 MED ORDER — LISINOPRIL 10 MG PO TABS: 10.0000 mg | ORAL_TABLET | Freq: Every day | ORAL | Status: DC

## 2012-12-09 MED ORDER — CARVEDILOL 12.5 MG PO TABS: 12.5000 mg | ORAL_TABLET | Freq: Two times a day (BID) | ORAL | Status: DC
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
## 2012-12-09 NOTE — Telephone Encounter (Signed)
Pt called again stating she thinks someone from our office called her back regarding her prescriptions.

## 2012-12-09 NOTE — Telephone Encounter (Signed)
Refill sent for lisinopril, carvedilol and pantaprazole.
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
## 2012-12-09 NOTE — Telephone Encounter (Signed)
LMTCB ASAP re: BPs This is after multiple other attempts

## 2012-12-10 ENCOUNTER — Telehealth: Payer: Self-pay | Admitting: Physician Assistant

## 2012-12-10 ENCOUNTER — Other Ambulatory Visit: Payer: Self-pay | Admitting: Physician Assistant
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
## 2012-12-13 ENCOUNTER — Ambulatory Visit (INDEPENDENT_AMBULATORY_CARE_PROVIDER_SITE_OTHER): Payer: Medicaid Other | Admitting: Cardiovascular Disease

## 2012-12-13 ENCOUNTER — Encounter: Payer: Self-pay | Admitting: Cardiovascular Disease

## 2012-12-13 VITALS — BP 170/112 | HR 86 | Ht 67.0 in | Wt 215.5 lb

## 2012-12-13 DIAGNOSIS — F329 Major depressive disorder, single episode, unspecified: Secondary | ICD-10-CM

## 2012-12-13 DIAGNOSIS — R0602 Shortness of breath: Secondary | ICD-10-CM

## 2012-12-13 DIAGNOSIS — F341 Dysthymic disorder: Secondary | ICD-10-CM

## 2012-12-13 DIAGNOSIS — I2510 Atherosclerotic heart disease of native coronary artery without angina pectoris: Secondary | ICD-10-CM

## 2012-12-13 DIAGNOSIS — F419 Anxiety disorder, unspecified: Secondary | ICD-10-CM

## 2012-12-13 DIAGNOSIS — F17200 Nicotine dependence, unspecified, uncomplicated: Secondary | ICD-10-CM

## 2012-12-13 DIAGNOSIS — Z720 Tobacco use: Secondary | ICD-10-CM

## 2012-12-13 DIAGNOSIS — I10 Essential (primary) hypertension: Secondary | ICD-10-CM

## 2012-12-13 DIAGNOSIS — I5181 Takotsubo syndrome: Secondary | ICD-10-CM

## 2012-12-13 MED ORDER — AMLODIPINE BESYLATE 10 MG PO TABS: 10.0000 mg | ORAL_TABLET | Freq: Every day | ORAL | Status: DC

## 2012-12-13 MED ORDER — HYDROCHLOROTHIAZIDE 25 MG PO TABS: 25.0000 mg | ORAL_TABLET | Freq: Every day | ORAL | Status: DC

## 2012-12-13 NOTE — Assessment & Plan Note (Signed)
We have encouraged her to continue to work on weaning her cigarettes and smoking cessation. She will continue to work on this and does not want any assistance with chantix.  
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
## 2012-12-13 NOTE — Assessment & Plan Note (Signed)
We have encouraged her to stay on her medications. Prior cardiomyopathy could have been secondary to severe hypertension. Medications will be adjusted today for high blood pressure.

## 2012-12-13 NOTE — Assessment & Plan Note (Addendum)
We'll increase her amlodipine to 10 mg daily, also add HCTZ 25 mg daily. She does report being compliant on her medications and has been with her today in the office.if blood pressure continues to be elevated, we could increase her lisinopril. For compliance, we could change the lisinopril and HCTZ to a combination pill.

## 2012-12-13 NOTE — Patient Instructions (Addendum)
Please increase the amlodipine to 10 mg daily, Start HCTZ one a day Stay on lisinopril one a day  Please call us if you have new issues that need to be addressed before your next appt.  Your physician wants you to follow-up in: 1 month with Dr. Kirke Corin .

## 2012-12-13 NOTE — Assessment & Plan Note (Signed)
Currently with no symptoms of angina. No further workup at this time. Continue current medication regimen. 
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
## 2013-01-12 ENCOUNTER — Ambulatory Visit (INDEPENDENT_AMBULATORY_CARE_PROVIDER_SITE_OTHER): Payer: Medicaid Other | Admitting: Cardiovascular Disease

## 2013-01-12 ENCOUNTER — Encounter: Payer: Self-pay | Admitting: Cardiovascular Disease

## 2013-01-12 VITALS — BP 132/94 | HR 87 | Ht 66.0 in | Wt 218.2 lb

## 2013-01-12 DIAGNOSIS — I10 Essential (primary) hypertension: Secondary | ICD-10-CM

## 2013-01-12 DIAGNOSIS — I5181 Takotsubo syndrome: Secondary | ICD-10-CM

## 2013-01-12 DIAGNOSIS — I2510 Atherosclerotic heart disease of native coronary artery without angina pectoris: Secondary | ICD-10-CM

## 2013-01-12 MED ORDER — LISINOPRIL 10 MG PO TABS: 10.0000 mg | ORAL_TABLET | Freq: Every day | ORAL | Status: DC

## 2013-01-12 MED ORDER — HYDROCHLOROTHIAZIDE 25 MG PO TABS: 25.0000 mg | ORAL_TABLET | Freq: Every day | ORAL | Status: DC

## 2013-01-12 MED ORDER — AMLODIPINE BESYLATE 10 MG PO TABS: 10.0000 mg | ORAL_TABLET | Freq: Every day | ORAL | Status: DC

## 2013-01-12 MED ORDER — SIMVASTATIN 40 MG PO TABS: 40.0000 mg | ORAL_TABLET | Freq: Every day | ORAL | Status: DC

## 2013-01-12 MED ORDER — CARVEDILOL 12.5 MG PO TABS: 12.5000 mg | ORAL_TABLET | Freq: Two times a day (BID) | ORAL | Status: DC

## 2013-01-12 NOTE — Assessment & Plan Note (Signed)
Her blood pressure is significantly improved on current medications which will be continued.

## 2013-01-12 NOTE — Patient Instructions (Addendum)
Continue same medications.  Follow up in 3 months.  
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
## 2013-01-12 NOTE — Assessment & Plan Note (Signed)
Ejection fraction was normal on most recent echocardiogram. Continue treatment with carvedilol and lisinopril.

## 2013-01-12 NOTE — Assessment & Plan Note (Signed)
Mild nonobstructive coronary artery disease on previous cardiac catheterization. Continue medical therapy.

## 2013-01-15 IMAGING — CT CT HEAD WITHOUT CONTRAST
3 of 4 series · 18 of 30 positions shown, 20 images · non-contrast
Comparison: none

REASON FOR EXAM: ha
COMMENTS:

PROCEDURE:     CT  - CT HEAD WITHOUT CONTRAST  - May 04, 2012  [DATE]
RESULT:     Comparison:  04/06/2012
TECHNIQUE: Multiple axial images from the foramen magnum to the vertex were
obtained without IV contrast.

[Series 2: without · axial · non-contrast · 0.41mm/px · z∈[-103,+2]mm · 7 of 29 slices shown, 9 images (1 of 2)]
[im 4/29  brain]
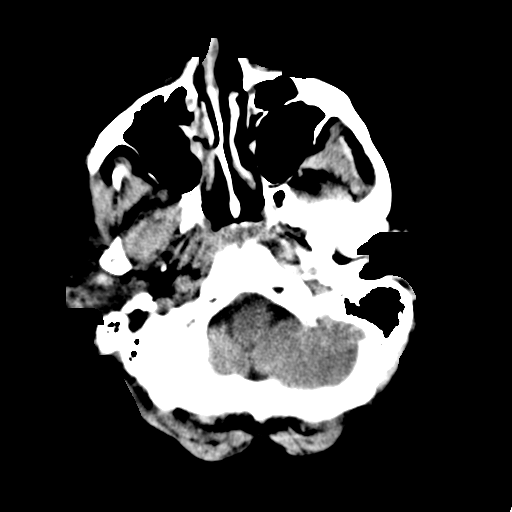
[im 4/29  bone]
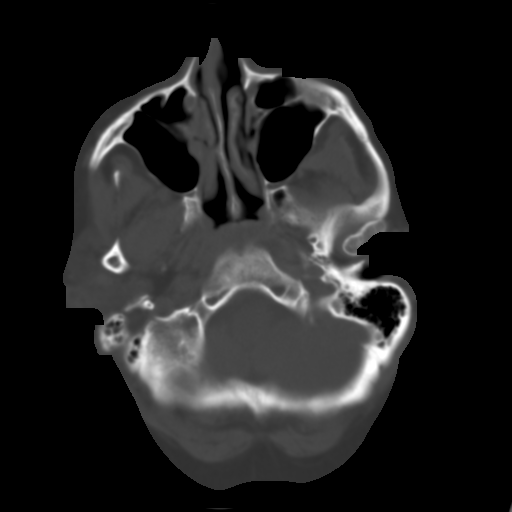
[im 8/29  brain]
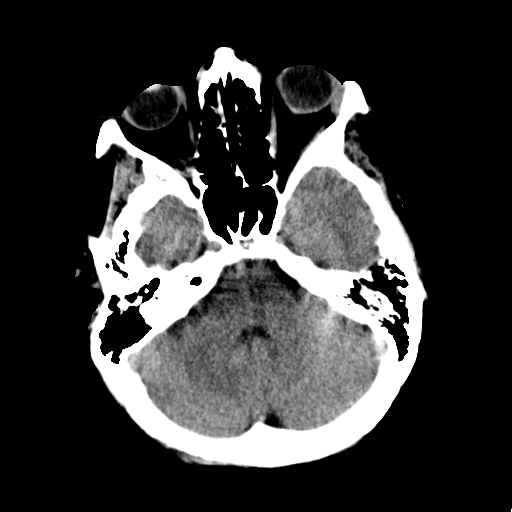
[im 11/29  brain]
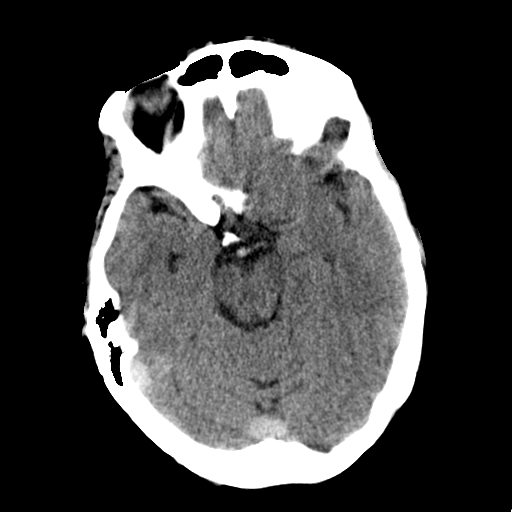
[im 15/29  brain]
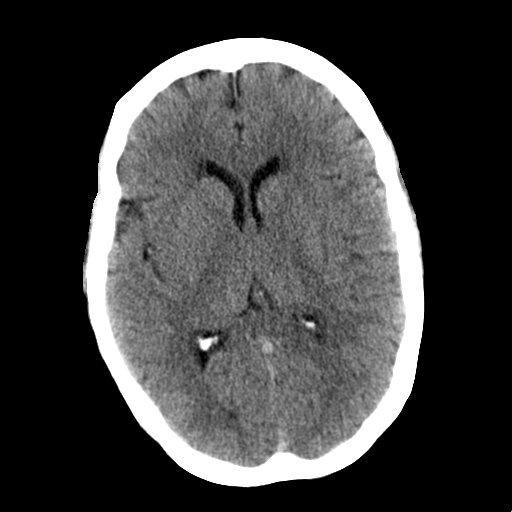
[im 18/29  brain]
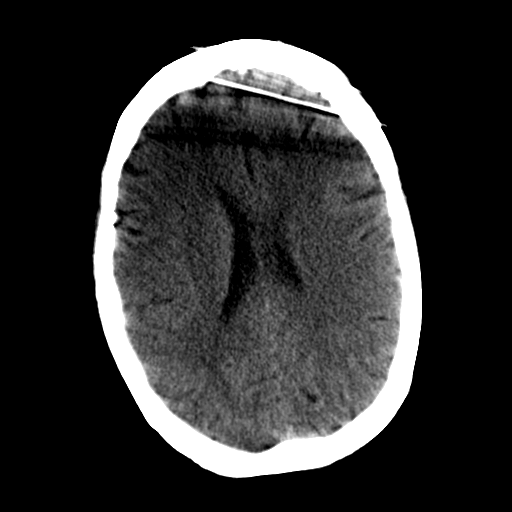
[im 18/29  bone]
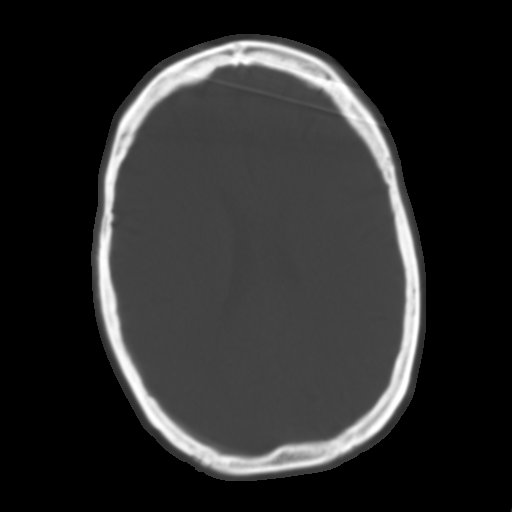
[im 22/29  brain]
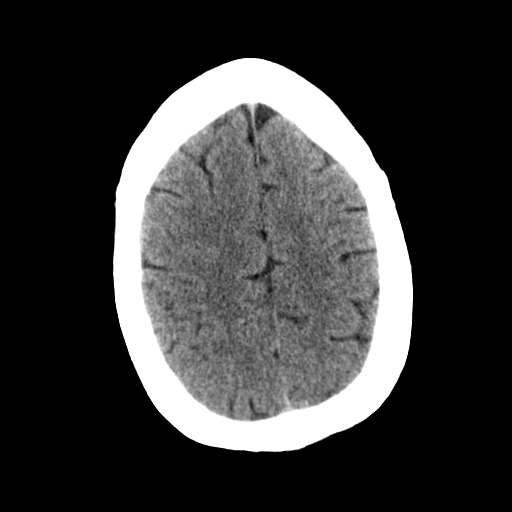
[im 25/29  brain]
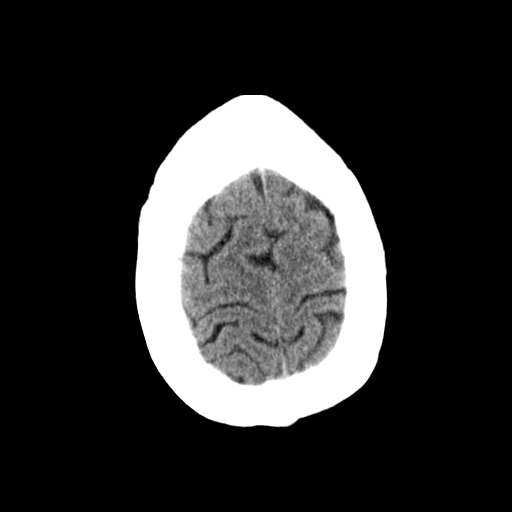

[Series 3: bone · axial · 0.41mm/px · z∈[-103,+2]mm · 7 of 29 slices shown]
[im 4/29  bone]
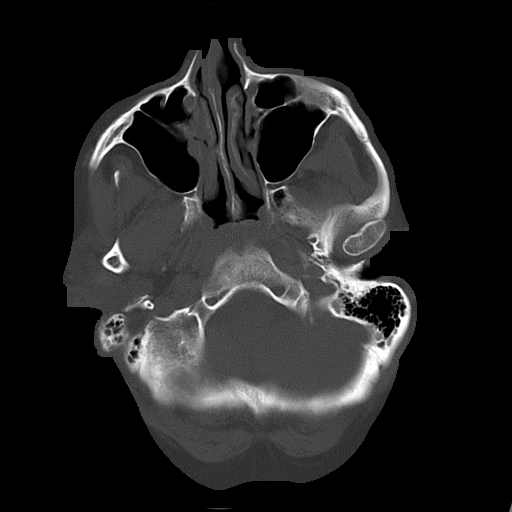
[im 8/29  bone]
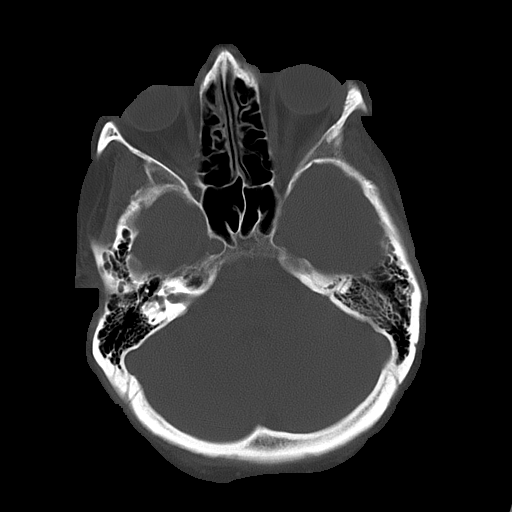
[im 11/29  bone]
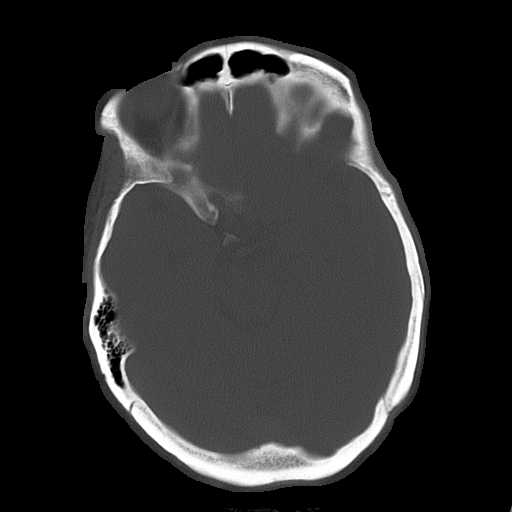
[im 15/29  bone]
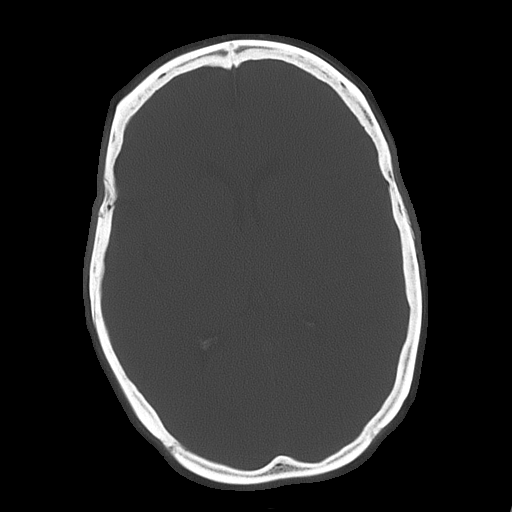
[im 18/29  bone]
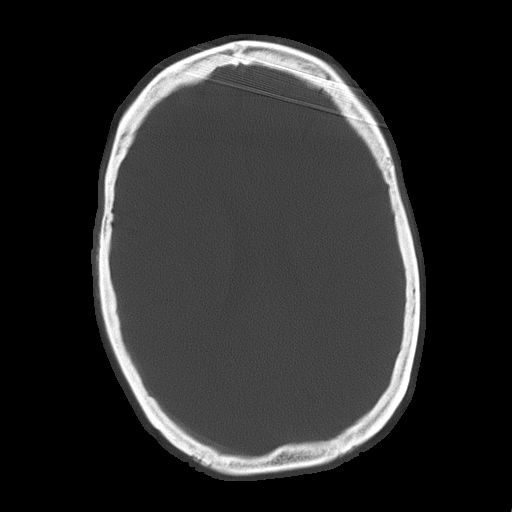
[im 22/29  bone]
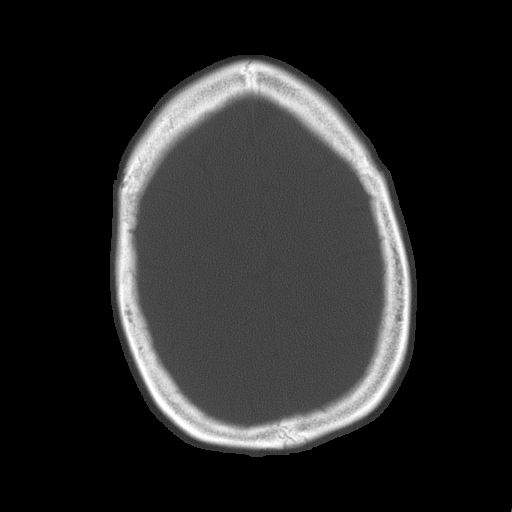
[im 25/29  bone]
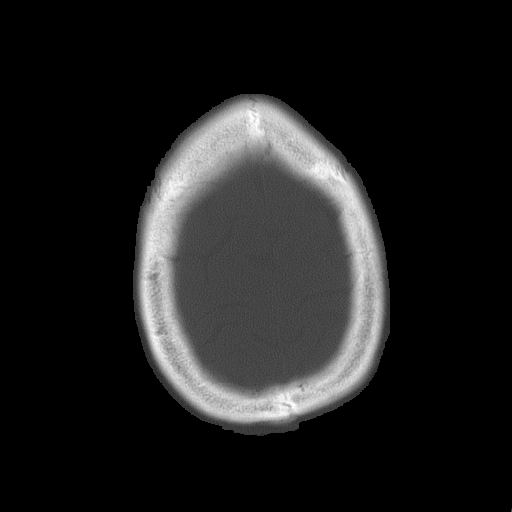

[Series 4: without · axial · non-contrast · 0.41mm/px · z∈[-42,+4]mm · 4 of 17 slices shown (2 of 2)]
[im 4/17  brain]
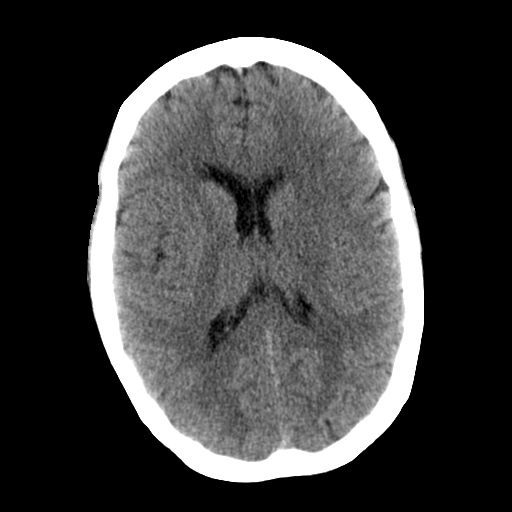
[im 7/17  brain]
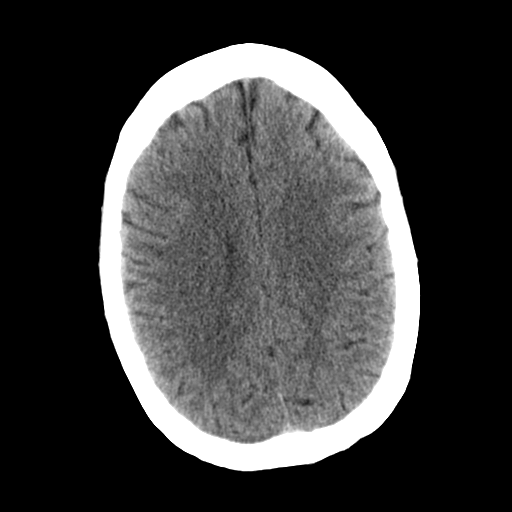
[im 10/17  brain]
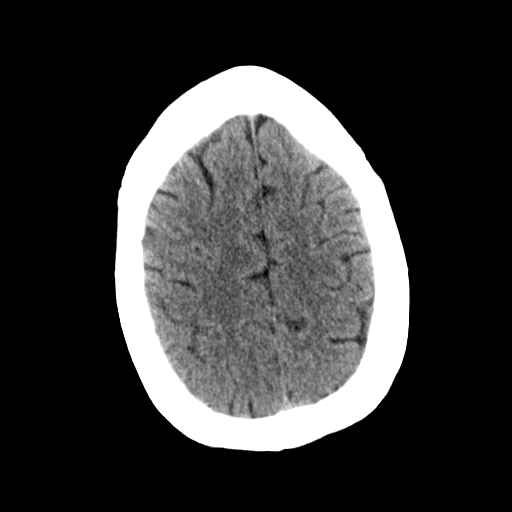
[im 13/17  brain]
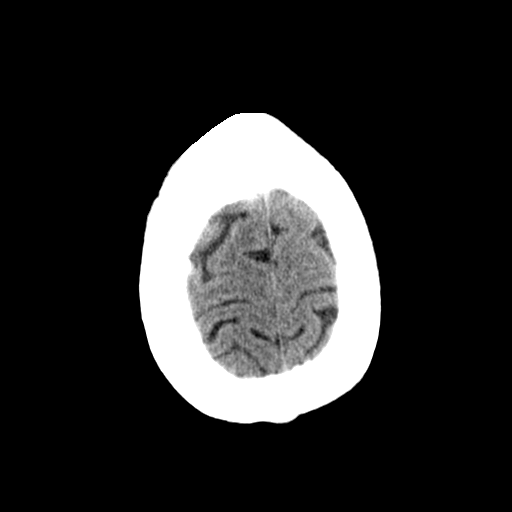

[18 of 30 positions shown; findings below may reference images not displayed]

FINDINGS: There is no evidence for mass effect, midline shift, or extra-axial fluid
collections. There is no evidence for space-occupying lesion, intracranial
hemorrhage, or cortical-based area of infarction.

The osseous structures are unremarkable.
IMPRESSION: No acute intracranial process.

[REDACTED]

## 2013-04-08 ENCOUNTER — Emergency Department: Payer: Self-pay | Admitting: Emergency Medicine

## 2013-04-08 LAB — COMPREHENSIVE METABOLIC PANEL
Albumin: 4.1 g/dL (ref 3.4–5.0)
Alkaline Phosphatase: 78 U/L (ref 50–136)
Anion Gap: 14 (ref 7–16)
BUN: 14 mg/dL (ref 7–18)
Bilirubin,Total: 0.7 mg/dL (ref 0.2–1.0)
Calcium, Total: 9.6 mg/dL (ref 8.5–10.1)
Chloride: 108 mmol/L — ABNORMAL HIGH (ref 98–107)
Co2: 20 mmol/L — ABNORMAL LOW (ref 21–32)
Creatinine: 0.64 mg/dL (ref 0.60–1.30)
EGFR (African American): >60
EGFR (Non-African Amer.): >60
Glucose: 122 mg/dL — ABNORMAL HIGH (ref 65–99)
Osmolality: 285 (ref 275–301)
Potassium: 3.1 mmol/L — ABNORMAL LOW (ref 3.5–5.1)
SGPT (ALT): 17 U/L (ref 12–78)

## 2013-04-08 LAB — CBC
HGB: 16.2 g/dL — ABNORMAL HIGH (ref 12.0–16.0)
MCH: 32.2 pg (ref 26.0–34.0)
MCHC: 34 g/dL (ref 32.0–36.0)
Platelet: 191 10*3/uL (ref 150–440)
RBC: 5.04 10*6/uL (ref 3.80–5.20)
RDW: 14 % (ref 11.5–14.5)
WBC: 11.1 10*3/uL — ABNORMAL HIGH (ref 3.6–11.0)

## 2013-04-08 LAB — TROPONIN I: Troponin-I: <0.02 ng/mL

## 2013-04-08 LAB — LIPASE, BLOOD: Lipase: 265 U/L (ref 73–393)

## 2013-04-14 ENCOUNTER — Ambulatory Visit: Payer: Medicaid Other | Admitting: Cardiovascular Disease

## 2013-05-12 ENCOUNTER — Encounter: Payer: Self-pay | Admitting: *Deleted

## 2013-05-12 ENCOUNTER — Encounter: Payer: Self-pay | Admitting: Cardiovascular Disease

## 2013-05-12 ENCOUNTER — Ambulatory Visit (INDEPENDENT_AMBULATORY_CARE_PROVIDER_SITE_OTHER): Payer: Medicaid Other | Admitting: Cardiovascular Disease

## 2013-05-12 VITALS — BP 132/98 | HR 69 | Ht 66.0 in | Wt 216.8 lb

## 2013-05-12 DIAGNOSIS — M79606 Pain in leg, unspecified: Secondary | ICD-10-CM | POA: Insufficient documentation

## 2013-05-12 DIAGNOSIS — E785 Hyperlipidemia, unspecified: Secondary | ICD-10-CM

## 2013-05-12 DIAGNOSIS — I10 Essential (primary) hypertension: Secondary | ICD-10-CM

## 2013-05-12 DIAGNOSIS — I2510 Atherosclerotic heart disease of native coronary artery without angina pectoris: Secondary | ICD-10-CM

## 2013-05-12 DIAGNOSIS — M79609 Pain in unspecified limb: Secondary | ICD-10-CM

## 2013-05-12 DIAGNOSIS — I5181 Takotsubo syndrome: Secondary | ICD-10-CM

## 2013-05-12 NOTE — Patient Instructions (Addendum)
Your physician has requested that you have an ankle brachial index (ABI). During this test an ultrasound and blood pressure cuff are used to evaluate the arteries that supply the arms and legs with blood. Allow thirty minutes for this exam. There are no restrictions or special instructions.  Continue same medications.   Follow up in 6 months.

## 2013-05-12 NOTE — Assessment & Plan Note (Signed)
Ejection fraction improved to normal. Continue treatment with carvedilol and lisinopril.

## 2013-05-12 NOTE — Assessment & Plan Note (Signed)
I suspect that this is neuropathic. She has reasonable distal pulses. Given her prolonged history of smoking, I will obtain an ABI . I asked her to followup with her primary care physician about pain management.

## 2013-05-12 NOTE — Progress Notes (Signed)
HPI  This is a 52 year old female who is here today for a followup visit regarding hypertension and  stress-induced cardiomyopathy. She had multiple hospitalizations at [facility] last year secondary to marked hypertension in the setting of nausea, vomiting, and anxiety. She was admitted in December of 2013 and found to have elevated troponin. She underwent diagnostic catheterization which showed nonobstructive CAD and an EF of 35%. This was felt to represent a stress-induced cardiomyopathy. She was placed on medical therapy including beta blocker and ACE inhibitor. She was readmitted At the beginning of 2014 with recurrent nausea, vomiting, anxiety, and hypertension. She was unable to keep her antihypertensives down at home. Echocardiogram showed normalization of LV function and given nonobstructive disease on catheterization in December, no further ischemic evaluation was pursued.  She had significant problems with her ex-husband.  She has been doing better over the last 6 months. She denies any chest pain or significant dyspnea. Her main issue seems to be bilateral leg discomfort both at rest and with walking. She continues to smoke. She could not go to her work due to the leg pain.  No Known Allergies   Current Outpatient Prescriptions on File Prior to Visit  Medication Sig Dispense Refill  . alprazolam (XANAX) 2 MG tablet Take 2 mg by mouth at bedtime as needed.      Marland Kitchen amLODipine (NORVASC) 10 MG tablet Take 1 tablet (10 mg total) by mouth daily.  180 tablet  6  . aspirin 81 MG tablet Takes 2 tablets daily.      Marland Kitchen atomoxetine (STRATTERA) 80 MG capsule Take 80 mg by mouth daily.      . bisacodyl (DULCOLAX) 5 MG EC tablet Take 5 mg by mouth daily as needed for constipation.      . carvedilol (COREG) 12.5 MG tablet Take 1 tablet (12.5 mg total) by mouth 2 (two) times daily with a meal.  60 tablet  6  . hydrochlorothiazide (HYDRODIURIL) 25 MG tablet Take 1 tablet (25 mg total) by mouth daily.  90  tablet  6  . lisinopril (PRINIVIL,ZESTRIL) 10 MG tablet Take 1 tablet (10 mg total) by mouth daily.  30 tablet  6  . metoCLOPramide (REGLAN) 5 MG tablet Take 5 mg by mouth 3 (three) times daily.      . pantoprazole (PROTONIX) 40 MG tablet Take 1 tablet (40 mg total) by mouth 2 (two) times daily.  60 tablet  6  . sertraline (ZOLOFT) 50 MG tablet Take 50 mg by mouth daily.      . simvastatin (ZOCOR) 40 MG tablet Take 1 tablet (40 mg total) by mouth daily.  30 tablet  6   No current facility-administered medications on file prior to visit.     Past Medical History  Diagnosis Date  . Dyslipidemia   . History of hyperkalemia   . Insomnia   . Malignant hypertension   . Polycythemia     chronic; due to smoking  . Anxiety and depression   . CVA (cerebrovascular accident) 03/2012  . Coronary artery disease nonobstructive    a. 09/2012 NSTEMI/Cath: LM nl, LAD 61m, 20d, LCX  20ost, /m, RCA 50p, 76m, EF 35%;  b. 10/2012 NSTEMI in setting of anxiety, n/v, htn emergency, EF 55% by echo-->felt to be stress induced.  . Stress-induced cardiomyopathy     a. 09/2012 EF 35%;  b. 10/2012 Echo: EF >55%, mod conc LVH, nl RV.  Marland Kitchen Hyperlipidemia   . Agoraphobia   . Hypokalemia  a. 10/2012 hospitalization  . Thrombocytopenia     a. 10/2012 hospitalization  . Obesity     a. previously over 400 lbs.  . Tobacco abuse     a. previously smoked 4 ppd.     Past Surgical History  Procedure Laterality Date  . Cardiac catheterization  09/2012    [facility]     History reviewed. No pertinent family history.   History   Social History  . Marital Status: Widowed    Spouse Name: N/A    Number of Children: N/A  . Years of Education: N/A   Occupational History  . Not on file.   Social History Main Topics  . Smoking status: Current Every Day Smoker -- 1.00 packs/day for 30 years    Types: Cigarettes  . Smokeless tobacco: Not on file  . Alcohol Use: No  . Drug Use: No  . Sexually Active:    Other  Topics Concern  . Not on file   Social History Narrative  . No narrative on file     PHYSICAL EXAM   BP 132/98  Pulse 69  Ht 5\' 6"  (1.676 m)  Wt 216 lb 12 oz (98.317 kg)  BMI 35 kg/m2 Constitutional: She is oriented to person, place, and time. She appears well-developed and well-nourished. No distress.  HENT: No nasal discharge.  Head: Normocephalic and atraumatic.  Eyes: Pupils are equal and round. Right eye exhibits no discharge. Left eye exhibits no discharge.  Neck: Normal range of motion. Neck supple. No JVD present. No thyromegaly present.  Cardiovascular: Normal rate, regular rhythm, normal heart sounds. Exam reveals no gallop and no friction rub. No murmur heard.  Pulmonary/Chest: Effort normal and breath sounds normal. No stridor. No respiratory distress. She has no wheezes. She has no rales. She exhibits no tenderness.  Abdominal: Soft. Bowel sounds are normal. She exhibits no distension. There is no tenderness. There is no rebound and no guarding.  Musculoskeletal: Normal range of motion. She exhibits no edema and no tenderness.  Neurological: She is alert and oriented to person, place, and time. Coordination normal.  Skin: Skin is warm and dry. No rash noted. She is not diaphoretic. No erythema. No pallor.  Psychiatric: She has a normal mood and affect. Her behavior is normal. Judgment and thought content normal.     EKG: Sinus  Rhythm  -Suspected lead misplacement  ABNORMAL    ASSESSMENT AND PLAN

## 2013-05-12 NOTE — Assessment & Plan Note (Signed)
Blood pressure is now reasonably controlled on current medications. Compliance has improved.

## 2013-05-24 DIAGNOSIS — R0989 Other specified symptoms and signs involving the circulatory and respiratory systems: Secondary | ICD-10-CM

## 2013-08-09 ENCOUNTER — Encounter (INDEPENDENT_AMBULATORY_CARE_PROVIDER_SITE_OTHER): Payer: Medicaid Other

## 2013-08-09 DIAGNOSIS — I739 Peripheral vascular disease, unspecified: Secondary | ICD-10-CM

## 2013-08-15 ENCOUNTER — Telehealth: Payer: Self-pay

## 2013-08-15 NOTE — Telephone Encounter (Signed)
Message copied by Marilynne Halsted on Tue Aug 15, 2013 11:39 AM ------      Message from: Lorine Bears A      Created: Fri Aug 11, 2013  4:14 PM       Normal ABI. No evidence of circulation problems. ------

## 2013-08-16 NOTE — Telephone Encounter (Signed)
Spoke w/ pt.  She is aware of results.  

## 2013-08-16 NOTE — Telephone Encounter (Signed)
Second attempt to reach pt regarding ABI results. No answer.  Voice mailbox full.

## 2013-08-16 NOTE — Telephone Encounter (Signed)
Message copied by Marilynne Halsted on Wed Aug 16, 2013 11:44 AM ------      Message from: Lorine Bears A      Created: Fri Aug 11, 2013  4:14 PM       Normal ABI. No evidence of circulation problems. ------

## 2013-08-16 NOTE — Telephone Encounter (Signed)
Message copied by Marilynne Halsted on Wed Aug 16, 2013  1:55 PM ------      Message from: Lorine Bears A      Created: Fri Aug 11, 2013  4:14 PM       Normal ABI. No evidence of circulation problems. ------

## 2013-09-02 LAB — URINALYSIS, COMPLETE
Bacteria: NONE SEEN
Bilirubin,UR: NEGATIVE
Blood: NEGATIVE
Ph: 7 (ref 4.5–8.0)
Protein: >=500
RBC,UR: 8 /HPF (ref 0–5)

## 2013-09-02 LAB — COMPREHENSIVE METABOLIC PANEL
Albumin: 4.5 g/dL (ref 3.4–5.0)
BUN: 16 mg/dL (ref 7–18)
Bilirubin,Total: 0.6 mg/dL (ref 0.2–1.0)
Calcium, Total: 10 mg/dL (ref 8.5–10.1)
Chloride: 106 mmol/L (ref 98–107)
Co2: 21 mmol/L (ref 21–32)
EGFR (Non-African Amer.): >60
Glucose: 138 mg/dL — ABNORMAL HIGH (ref 65–99)
SGOT(AST): 45 U/L — ABNORMAL HIGH (ref 15–37)
SGPT (ALT): 27 U/L (ref 12–78)
Sodium: 138 mmol/L (ref 136–145)
Total Protein: 8.6 g/dL — ABNORMAL HIGH (ref 6.4–8.2)

## 2013-09-02 LAB — CBC
HCT: 55.2 % — ABNORMAL HIGH (ref 35.0–47.0)
HGB: 18.6 g/dL — ABNORMAL HIGH (ref 12.0–16.0)
MCH: 31.5 pg (ref 26.0–34.0)
MCV: 94 fL (ref 80–100)
Platelet: 270 10*3/uL (ref 150–440)
RBC: 5.9 10*6/uL — ABNORMAL HIGH (ref 3.80–5.20)
RDW: 13.8 % (ref 11.5–14.5)
WBC: 14.4 10*3/uL — ABNORMAL HIGH (ref 3.6–11.0)

## 2013-09-02 LAB — CK TOTAL AND CKMB (NOT AT ARMC): CK-MB: 1.6 ng/mL (ref 0.5–3.6)

## 2013-09-02 LAB — TROPONIN I: Troponin-I: <0.02 ng/mL

## 2013-09-03 ENCOUNTER — Inpatient Hospital Stay: Payer: Self-pay | Admitting: Internal Medicine

## 2013-09-05 ENCOUNTER — Telehealth: Payer: Self-pay

## 2013-09-05 NOTE — Telephone Encounter (Signed)
Spoke w/ pt.  She is concerned that she is on too many meds and would like to come in and talk w/ Dr. Kirke Corin. Pt sched for 09/12/13 @ 4:15.

## 2013-09-05 NOTE — Telephone Encounter (Signed)
Pt called and states she was discharged from [facility] for pneumonia, and wants to talk about her medications that they have added, states she is "scared about all these medications she is taking". Please call.

## 2013-09-12 ENCOUNTER — Ambulatory Visit: Payer: Medicaid Other | Admitting: Cardiovascular Disease

## 2013-09-20 ENCOUNTER — Other Ambulatory Visit: Payer: Self-pay | Admitting: *Deleted

## 2013-09-20 ENCOUNTER — Other Ambulatory Visit: Payer: Self-pay | Admitting: Cardiovascular Disease

## 2013-09-20 MED ORDER — CARVEDILOL 12.5 MG PO TABS: 12.5000 mg | ORAL_TABLET | Freq: Two times a day (BID) | ORAL | Status: DC

## 2013-09-20 NOTE — Telephone Encounter (Signed)
Requested Prescriptions   Signed Prescriptions Disp Refills  . carvedilol (COREG) 12.5 MG tablet 60 tablet 1    Sig: Take 1 tablet (12.5 mg total) by mouth 2 (two) times daily with a meal.    Authorizing Provider: Lorine Bears A    Ordering User: Kendrick Fries

## 2013-11-09 ENCOUNTER — Ambulatory Visit: Payer: Medicaid Other | Admitting: Cardiovascular Disease

## 2013-11-14 ENCOUNTER — Encounter: Payer: Self-pay | Admitting: *Deleted

## 2013-11-14 ENCOUNTER — Ambulatory Visit: Payer: Medicaid Other | Admitting: Cardiovascular Disease

## 2013-11-30 ENCOUNTER — Telehealth: Payer: Self-pay | Admitting: *Deleted

## 2013-11-30 NOTE — Telephone Encounter (Signed)
LVM for patient to reschedule missed appt  

## 2013-12-13 ENCOUNTER — Emergency Department: Payer: Self-pay | Admitting: Emergency Medicine

## 2013-12-13 LAB — COMPREHENSIVE METABOLIC PANEL
ALBUMIN: 3.7 g/dL (ref 3.4–5.0)
ALT: 15 U/L (ref 12–78)
Alkaline Phosphatase: 66 U/L
Anion Gap: 8 (ref 7–16)
BUN: 15 mg/dL (ref 7–18)
Bilirubin,Total: 0.4 mg/dL (ref 0.2–1.0)
CHLORIDE: 112 mmol/L — AB (ref 98–107)
Calcium, Total: 9.5 mg/dL (ref 8.5–10.1)
Co2: 24 mmol/L (ref 21–32)
Creatinine: 0.63 mg/dL (ref 0.60–1.30)
EGFR (Non-African Amer.): >60
Glucose: 127 mg/dL — ABNORMAL HIGH (ref 65–99)
Osmolality: 289 (ref 275–301)
POTASSIUM: 3.4 mmol/L — AB (ref 3.5–5.1)
SGOT(AST): 13 U/L — ABNORMAL LOW (ref 15–37)
SODIUM: 144 mmol/L (ref 136–145)
TOTAL PROTEIN: 7.5 g/dL (ref 6.4–8.2)

## 2013-12-13 LAB — URINALYSIS, COMPLETE
Bilirubin,UR: NEGATIVE
GLUCOSE, UR: NEGATIVE mg/dL (ref 0–75)
KETONE: NEGATIVE
LEUKOCYTE ESTERASE: NEGATIVE
NITRITE: NEGATIVE
Ph: 6 (ref 4.5–8.0)
Protein: 100
RBC,UR: 5 /HPF (ref 0–5)
Specific Gravity: 1.023 (ref 1.003–1.030)
Squamous Epithelial: 2

## 2013-12-13 LAB — CBC
HCT: 49.7 % — ABNORMAL HIGH (ref 35.0–47.0)
HGB: 16.4 g/dL — ABNORMAL HIGH (ref 12.0–16.0)
MCH: 31.4 pg (ref 26.0–34.0)
MCHC: 32.9 g/dL (ref 32.0–36.0)
MCV: 96 fL (ref 80–100)
Platelet: 156 10*3/uL (ref 150–440)
RBC: 5.2 10*6/uL (ref 3.80–5.20)
RDW: 14 % (ref 11.5–14.5)
WBC: 12.6 10*3/uL — ABNORMAL HIGH (ref 3.6–11.0)

## 2013-12-13 LAB — LIPASE, BLOOD: Lipase: 240 U/L (ref 73–393)

## 2014-01-16 ENCOUNTER — Other Ambulatory Visit: Payer: Self-pay | Admitting: Cardiovascular Disease

## 2014-01-23 ENCOUNTER — Encounter: Payer: Self-pay | Admitting: Cardiovascular Disease

## 2014-01-23 ENCOUNTER — Ambulatory Visit (INDEPENDENT_AMBULATORY_CARE_PROVIDER_SITE_OTHER): Payer: Medicaid Other | Admitting: Cardiovascular Disease

## 2014-01-23 VITALS — BP 137/95 | HR 74 | Ht 66.5 in | Wt 219.5 lb

## 2014-01-23 DIAGNOSIS — I2510 Atherosclerotic heart disease of native coronary artery without angina pectoris: Secondary | ICD-10-CM

## 2014-01-23 DIAGNOSIS — I10 Essential (primary) hypertension: Secondary | ICD-10-CM

## 2014-01-23 DIAGNOSIS — E785 Hyperlipidemia, unspecified: Secondary | ICD-10-CM

## 2014-01-23 DIAGNOSIS — I5181 Takotsubo syndrome: Secondary | ICD-10-CM

## 2014-01-23 MED ORDER — LISINOPRIL 10 MG PO TABS: 10.0000 mg | ORAL_TABLET | Freq: Two times a day (BID) | ORAL | Status: DC

## 2014-01-23 MED ORDER — CARVEDILOL 12.5 MG PO TABS: 12.5000 mg | ORAL_TABLET | Freq: Two times a day (BID) | ORAL | Status: DC

## 2014-01-23 MED ORDER — SIMVASTATIN 20 MG PO TABS: 20.0000 mg | ORAL_TABLET | Freq: Every day | ORAL | Status: DC

## 2014-01-23 MED ORDER — AMLODIPINE BESYLATE 10 MG PO TABS: 10.0000 mg | ORAL_TABLET | Freq: Every day | ORAL | Status: DC

## 2014-01-23 MED ORDER — HYDROCHLOROTHIAZIDE 25 MG PO TABS: 25.0000 mg | ORAL_TABLET | Freq: Every day | ORAL | Status: DC

## 2014-01-23 NOTE — Patient Instructions (Signed)
Decrease Simvastatin to 20 mg once daily.  Continue other medications.   Your physician wants you to follow-up in: 6 months.  You will receive a reminder letter in the mail two months in advance. If you don't receive a letter, please call our office to schedule the follow-up appointment.

## 2014-01-23 NOTE — Progress Notes (Signed)
HPI  This is a 53 year old female who is here today for a followup visit regarding hypertension and  stress-induced cardiomyopathy. She had multiple hospitalizations at [facility] in 2013 secondary to marked hypertension in the setting of nausea, vomiting, and anxiety. She was admitted in December of 2013 and found to have elevated troponin. She underwent diagnostic catheterization which showed nonobstructive CAD and an EF of 35%. This was felt to represent a stress-induced cardiomyopathy. She was placed on medical therapy including beta blocker and ACE inhibitor. She was readmitted At the beginning of 2014 with recurrent nausea, vomiting, anxiety, and hypertension. She was unable to keep her antihypertensives down at home. Echocardiogram showed normalization of LV function . She had significant problems with her ex-husband.  During last visit, she complained of bilateral leg pain. She underwent an ABI which was normal. She continues to smoke.  She has been doing very well and denies chest pain or dyspnea.  No Known Allergies   Current Outpatient Prescriptions on File Prior to Visit  Medication Sig Dispense Refill  . alprazolam (XANAX) 2 MG tablet Take 2 mg by mouth at bedtime as needed.      Marland Kitchen. aspirin 81 MG tablet Takes 2 tablets daily.      Marland Kitchen. atomoxetine (STRATTERA) 80 MG capsule Take 80 mg by mouth daily.      . bisacodyl (DULCOLAX) 5 MG EC tablet Take 5 mg by mouth daily as needed for constipation.      . metoCLOPramide (REGLAN) 5 MG tablet Take 5 mg by mouth 3 (three) times daily.      . pantoprazole (PROTONIX) 40 MG tablet Take 1 tablet (40 mg total) by mouth 2 (two) times daily.  60 tablet  6  . sertraline (ZOLOFT) 50 MG tablet Take 50 mg by mouth daily.       No current facility-administered medications on file prior to visit.     Past Medical History  Diagnosis Date  . Dyslipidemia   . History of hyperkalemia   . Insomnia   . Malignant hypertension   . Polycythemia    chronic; due to smoking  . Anxiety and depression   . CVA (cerebrovascular accident) 03/2012  . Coronary artery disease nonobstructive    a. 09/2012 NSTEMI/Cath: LM nl, LAD 6346m, 20d, LCX  20ost, /m, RCA 50p, 2866m, EF 35%;  b. 10/2012 NSTEMI in setting of anxiety, n/v, htn emergency, EF 55% by echo-->felt to be stress induced.  . Stress-induced cardiomyopathy     a. 09/2012 EF 35%;  b. 10/2012 Echo: EF >55%, mod conc LVH, nl RV.  Marland Kitchen. Hyperlipidemia   . Agoraphobia   . Hypokalemia     a. 10/2012 hospitalization  . Thrombocytopenia     a. 10/2012 hospitalization  . Obesity     a. previously over 400 lbs.  . Tobacco abuse     a. previously smoked 4 ppd.     Past Surgical History  Procedure Laterality Date  . [facility]     Family History  Problem Relation Age of Onset  . Family history unknown: Yes     History   Social History  . Marital Status: Widowed    Spouse Name: N/A    Number of Children: N/A  . Years of Education: N/A   Occupational History  . Not on file.   Social History Main Topics  . Smoking status: Current Every Day Smoker -- 1.00 packs/day for 30 years  Types: Cigarettes  . Smokeless tobacco: Not on file  . Alcohol Use: No  . Drug Use: No  . Sexual Activity: Not on file   Other Topics Concern  . Not on file   Social History Narrative  . No narrative on file     PHYSICAL EXAM   BP 137/95  Pulse 74  Ht 5' 6.5" (1.689 m)  Wt 219 lb 8 oz (99.565 kg)  BMI 34.90 kg/m2 Constitutional: She is oriented to person, place, and time. She appears well-developed and well-nourished. No distress.  HENT: No nasal discharge.  Head: Normocephalic and atraumatic.  Eyes: Pupils are equal and round. Right eye exhibits no discharge. Left eye exhibits no discharge.  Neck: Normal range of motion. Neck supple. No JVD present. No thyromegaly present.  Cardiovascular: Normal rate, regular rhythm, normal heart sounds. Exam reveals no gallop  and no friction rub. No murmur heard.  Pulmonary/Chest: Effort normal and breath sounds normal. No stridor. No respiratory distress. She has no wheezes. She has no rales. She exhibits no tenderness.  Abdominal: Soft. Bowel sounds are normal. She exhibits no distension. There is no tenderness. There is no rebound and no guarding.  Musculoskeletal: Normal range of motion. She exhibits no edema and no tenderness.  Neurological: She is alert and oriented to person, place, and time. Coordination normal.  Skin: Skin is warm and dry. No rash noted. She is not diaphoretic. No erythema. No pallor.  Psychiatric: She has a normal mood and affect. Her behavior is normal. Judgment and thought content normal.     ASSESSMENT AND PLAN

## 2014-01-25 NOTE — Assessment & Plan Note (Signed)
Previous cardiac catheterization showed mild to moderate nonobstructive coronary artery disease. Continue medical therapy. She has no symptoms of angina.

## 2014-01-25 NOTE — Assessment & Plan Note (Signed)
Given that she is on a calcium channel blocker, I decreased the dose of simvastatin 20 mg once daily.

## 2014-01-25 NOTE — Assessment & Plan Note (Signed)
Ejection fraction subsequently went back to normal. Continue blood pressure control.

## 2014-01-25 NOTE — Assessment & Plan Note (Signed)
Blood pressure is well controlled on current medications. 

## 2014-04-25 ENCOUNTER — Inpatient Hospital Stay: Payer: Self-pay | Admitting: Student

## 2014-04-25 LAB — URINALYSIS, COMPLETE
BACTERIA: NEGATIVE
BILIRUBIN, UR: NEGATIVE
Glucose,UR: NEGATIVE mg/dL (ref 0–75)
Leukocyte Esterase: NEGATIVE
Nitrite: NEGATIVE
Ph: 6 (ref 4.5–8.0)
Protein: 500
Specific Gravity: 1.02 (ref 1.003–1.030)

## 2014-04-25 LAB — DRUG SCREEN, URINE
Amphetamines, Ur Screen: NEGATIVE (ref ?–1000)
Barbiturates, Ur Screen: NEGATIVE (ref ?–200)
Benzodiazepine, Ur Scrn: POSITIVE (ref ?–200)
Cannabinoid 50 Ng, Ur ~~LOC~~: POSITIVE (ref ?–50)
Cocaine Metabolite,Ur ~~LOC~~: NEGATIVE (ref ?–300)
MDMA (ECSTASY) UR SCREEN: NEGATIVE (ref ?–500)
Methadone, Ur Screen: NEGATIVE (ref ?–300)
Opiate, Ur Screen: NEGATIVE (ref ?–300)
Phencyclidine (PCP) Ur S: NEGATIVE (ref ?–25)
Tricyclic, Ur Screen: NEGATIVE (ref ?–1000)

## 2014-04-25 LAB — CBC
HCT: 53 % — AB (ref 35.0–47.0)
HGB: 17.9 g/dL — AB (ref 12.0–16.0)
MCH: 32.7 pg (ref 26.0–34.0)
MCHC: 33.8 g/dL (ref 32.0–36.0)
MCV: 97 fL (ref 80–100)
Platelet: 211 10*3/uL (ref 150–440)
RBC: 5.48 10*6/uL — ABNORMAL HIGH (ref 3.80–5.20)
RDW: 13.4 % (ref 11.5–14.5)
WBC: 13.4 10*3/uL — ABNORMAL HIGH (ref 3.6–11.0)

## 2014-04-25 LAB — LIPASE, BLOOD: Lipase: 169 U/L (ref 73–393)

## 2014-04-25 LAB — COMPREHENSIVE METABOLIC PANEL
ALBUMIN: 3.8 g/dL (ref 3.4–5.0)
ALT: 17 U/L (ref 12–78)
Alkaline Phosphatase: 62 U/L
Anion Gap: 10 (ref 7–16)
BILIRUBIN TOTAL: 0.7 mg/dL (ref 0.2–1.0)
BUN: 13 mg/dL (ref 7–18)
CHLORIDE: 100 mmol/L (ref 98–107)
CREATININE: 0.52 mg/dL — AB (ref 0.60–1.30)
Calcium, Total: 9.4 mg/dL (ref 8.5–10.1)
Co2: 27 mmol/L (ref 21–32)
EGFR (Non-African Amer.): >60
Glucose: 110 mg/dL — ABNORMAL HIGH (ref 65–99)
Osmolality: 275 (ref 275–301)
Potassium: 2.8 mmol/L — ABNORMAL LOW (ref 3.5–5.1)
SGOT(AST): 19 U/L (ref 15–37)
Sodium: 137 mmol/L (ref 136–145)
Total Protein: 7.9 g/dL (ref 6.4–8.2)

## 2014-04-25 LAB — CK TOTAL AND CKMB (NOT AT ARMC)
CK, Total: 135 U/L
CK-MB: 1.4 ng/mL (ref 0.5–3.6)

## 2014-04-25 LAB — TROPONIN I: Troponin-I: <0.02 ng/mL

## 2014-04-26 LAB — BASIC METABOLIC PANEL
ANION GAP: 9 (ref 7–16)
BUN: 15 mg/dL (ref 7–18)
CALCIUM: 9.1 mg/dL (ref 8.5–10.1)
CO2: 28 mmol/L (ref 21–32)
Chloride: 106 mmol/L (ref 98–107)
Creatinine: 0.7 mg/dL (ref 0.60–1.30)
EGFR (African American): >60
Glucose: 92 mg/dL (ref 65–99)
Osmolality: 285 (ref 275–301)
Potassium: 2.8 mmol/L — ABNORMAL LOW (ref 3.5–5.1)
SODIUM: 143 mmol/L (ref 136–145)

## 2014-04-26 LAB — CBC WITH DIFFERENTIAL/PLATELET
BASOS ABS: 0 10*3/uL (ref 0.0–0.1)
BASOS PCT: 0.6 %
EOS ABS: 0 10*3/uL (ref 0.0–0.7)
Eosinophil %: 0.7 %
HCT: 50.3 % — AB (ref 35.0–47.0)
HGB: 16.5 g/dL — ABNORMAL HIGH (ref 12.0–16.0)
LYMPHS ABS: 1.7 10*3/uL (ref 1.0–3.6)
LYMPHS PCT: 25.1 %
MCH: 31.1 pg (ref 26.0–34.0)
MCHC: 32.8 g/dL (ref 32.0–36.0)
MCV: 95 fL (ref 80–100)
Monocyte #: 0.5 x10 3/mm (ref 0.2–0.9)
Monocyte %: 7.1 %
Neutrophil #: 4.6 10*3/uL (ref 1.4–6.5)
Neutrophil %: 66.5 %
Platelet: 143 10*3/uL — ABNORMAL LOW (ref 150–440)
RBC: 5.32 10*6/uL — AB (ref 3.80–5.20)
RDW: 13.9 % (ref 11.5–14.5)
WBC: 6.9 10*3/uL (ref 3.6–11.0)

## 2014-04-26 LAB — POTASSIUM: POTASSIUM: 4.4 mmol/L (ref 3.5–5.1)

## 2014-09-03 ENCOUNTER — Encounter: Payer: Self-pay | Admitting: *Deleted

## 2014-09-03 ENCOUNTER — Ambulatory Visit: Payer: Medicaid Other | Admitting: Cardiovascular Disease

## 2015-02-05 NOTE — H&P (Signed)
PATIENT NAME:  [name], [name] MR#:  [id number] DATE OF BIRTH:  20-Sep-1961  DATE OF ADMISSION:  05/05/2012  REFERRING PHYSICIAN: Dr. Benjaman Lobe    PRIMARY CARE PHYSICIAN: [facility]   PRESENTING COMPLAINT: Headache, nausea, vomiting, syncope.   HISTORY OF PRESENT ILLNESS: [name] is a 54 year old woman with history of hypertension, malignant, with past admissions for similar issues, history of CVA by MRI on last admission, tobacco abuse, obesity, anxiety, depression, and dyslipidemia who represents with complaints of worsening headache this evening. The patient reports that since her discharge on 04/08/2012 her headache was actually improving. She was painting at a friend's house and this evening began to develop hot spells and sat down and had a transient syncopal spell for maybe one second with headache, nausea, and vomiting. No visual disturbance this time. She denies any chest pain or shortness of breath. Reports that she noticed some leg swelling today.   PAST MEDICAL HISTORY:  1. Malignant hypertension. This is her third admission since 04/03/2012. Her last admission was between 06/19 to 04/08/2012, was seen by Neurology at that time. MRI was revealing for subacute CVA. She had ESR, CRP, ANA, and MRA performed that was unrevealing as well as carotid ultrasound.  2. Anxiety/depression.  3. Insomnia.  4. Polycythemia that appears to be chronic.  5. Dyslipidemia.  6. Tobacco abuse.  7. Obesity.   ALLERGIES: No known drug allergies.   MEDICATIONS:  1. Alprazolam 0.5 mg every six hours as needed.  2. Aspirin 325 mg daily.  3. Clonidine 0.1 mg b.i.d.  4. Hydrochlorothiazide/lisinopril 12.5 mg/20 mg b.i.d.  5. Sertraline 100 mg daily.  6. Simvastatin 10 mg at bedtime.  FAMILY HISTORY: Diabetes, hypertension, and stroke.   SOCIAL HISTORY: She denies any IV drug use. Has occasional alcohol use and smokes 1 pack per day. 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   PAST  SURGICAL HISTORY: None.   REVIEW OF SYSTEMS: CONSTITUTIONAL: No fevers. She endorses nausea or vomiting. EYES: No visual disturbance during this admission. ENT: No epistaxis or discharge. RESPIRATORY: No cough or hemoptysis. CARDIOVASCULAR: No chest pain. Reports syncope as per history of present illness and edema. GI: Endorses nausea and vomiting. No abdominal pain, hematemesis, or melena. GU: No dysuria or hematuria. ENDOCRINE: No polyuria or polydipsia. HEME: No bleeding. SKIN: No ulcers. MUSCULOSKELETAL: No joint swelling. NEUROLOGICAL: Reports posterior headache. No history of seizure. She has history of CVA by MRI. PSYCH: Denies any suicidal ideation. She does endorse depression.   PHYSICAL EXAMINATION:   VITAL SIGNS: Temperature 98.9, pulse 105, respiratory rate 18, blood pressure 170/86 with repeat blood pressures in the 200's/100's. Last blood pressure reading was 107/65, sating 100% on room air.   GENERAL: Lying in bed in no apparent distress. She is in discomfort.   HEENT: Normocephalic, atraumatic. Pupils equal, symmetric, nonicteric. Nares without discharge. She has moist mucous membranes.   NECK: Soft and supple. No adenopathy or JVP.   CARDIOVASCULAR: Mildly tachy. No murmurs, rubs, or gallops.   LUNGS: Clear to auscultation bilaterally. No use of accessory muscles or increased respiratory effort.   ABDOMEN: Soft. Positive bowel sounds. No mass appreciated.   EXTREMITIES: No edema. Dorsal pedis pulses intact.   MUSCULOSKELETAL: No joint effusion.   SKIN: No ulcers.   NEUROLOGIC: No dysarthria or aphasia. Symmetrical strength.   PSYCH: She is alert and oriented. The patient is cooperative.   PERTINENT LABS AND STUDIES: Glucose 131, BUN 26, creatinine 1.03, sodium 141, potassium 3.4, chloride 106, carbon dioxide 21,  calcium 9.5. LFTs within normal limits. WBC 11.9, hemoglobin 17.3, hematocrit 53.7, platelet 151, MCV 98. Lipase 171.   CT of the head without contrast shows  no acute findings.   Urinalysis with specific gravity of 1.01, pH 5, trace leukocyte esterase, RBCs 5 per high-power field, WBCs 4 per high-power field.   ASSESSMENT AND PLAN: [name] is a 54 year old woman with history of malignant hypertension, CVA by MRI, dyslipidemia, obesity, and tobacco abuse presenting with headache, nausea, vomiting, and reports of syncope.  1. Hypertension, malignant, headache and syncope likely a combination of elevated blood pressure and inhalation of paint fumes plus/minus heat exhaustion/dehydration. Continue to treat symptoms. Gentle fluid repletion. Suspect there would be some improvement in her symptoms with stabilization of her blood pressures. Peak blood pressures in 200's/100's but given last reading with significant drop to 107/65 will hold her Catapres patch that she was started on currently. She has received oral clonidine and labetalol x2 in the ED. Will hold her other blood pressure medications with lisinopril and hydrochlorothiazide as well. She does endorse compliance with her medications. This is the third admission since 04/03/2012 for similar issues. She has been evaluated by Neurology. I wonder if she needs additional work-up for her labile blood pressures versus the possibility of noncompliance.  2. CVA by MRI on last admission. Will continue on aspirin.  3. Dyslipidemia. Restart her simvastatin and send fasting lipid panel.  4. Hypokalemia, holding her HCTZ as above. Replace as needed. Send mag level.  5. Polycythemia, appears to be chronic. The patient would benefit with follow-up to Hematology/Oncology. I suspect that this is in the setting of her longstanding tobacco abuse.  6. Prophylaxis with aspirin, Protonix, and Lovenox.   TIME SPENT: Approximately 50 minutes spent on patient care.   ____________________________ Rita Ohara, MD ap:drc D: 05/05/2012 01:14:00 ET T: 05/05/2012 07:32:00 ET JOB#: [id number]  cc: Brien Few Ayriana Wix, MD,  <Dictator> [facility] MD ELECTRONICALLY SIGNED 05/07/2012 0:49

## 2015-02-05 NOTE — H&P (Signed)
PATIENT NAME:  [name], SALLAS MR#:  [id number] DATE OF BIRTH:  Aug 02, 1961  DATE OF ADMISSION:  09/21/2012  REFERRING PHYSICIAN: Francene Castle, MD    PRIMARY CARE PHYSICIAN: [place], Dr. Silvestre Gunner    CHIEF COMPLAINT: Headache, nausea, vomiting.   HISTORY OF PRESENT ILLNESS: The patient is a 54 year old female with history of anxiety, depression, insomnia, chronic polycythemia, ongoing smoking, dyslipidemia, history of malignant hypertension. The patient was admitted several times in June and July for malignant hypertension. In July, she also had a subacute cerebrovascular accident on her MRI. Extensive work-up, including ESR, CRP, ANA,  MRA, and carotid ultrasound were done and were unrevealing. She was last admitted 05/05/2012 for malignant hypertension, headache, nausea, vomiting, and syncope due to heat exhaustion, dehydration and inhalation of paint fumes. The patient reports that she has been doing well since her last admission but awoke up around 1:00 this morning with nausea, vomiting, headache. She denies any chest pain, cough, fever, abdominal pain, diarrhea, or constipation. She denied any chest pain. She reported that she had been short of breath for some time. She has a questionable history of medication compliance and on further questioning reported that she had not taken her Diovan for the last four days. The Pharmacy Tech contacted the patient's Pharmacy.  As per the patient's Pharmacy, the patient had only refilled her Xanax and had picked it up on 11/14. The rest of her medications, including antihypertensive medications, have been ready since 11/25, but she has not picked them up. Her copay for her  medications was only $3-$4. In the ED,  the patient continues to have nausea, vomiting, and headache. Her blood pressure is not controlled. Her current blood pressure  is 227/137. She was also found to have elevated troponin of 2.40. Her last echo is from June 2013 which  showed normal ejection fraction with normal left ventricular wall motion. Her EKG does show some evidence of ST-T wave changes.   ALLERGIES: No known drug allergies.   CURRENT MEDICATIONS:  1. Xanax 1 mg t.i.d., 2 mg at bedtime.  2. Diovan 40 mg daily.  3. HCTZ/lisinopril 12.5/20, 1 tablet once a day. 4. Promethazine 25 mg every 4 to 6 hours p.r.n.  5. Zoloft 50 mg daily.  6. Simvastatin 20 mg daily.  7. Strattera 80 mg daily.   As mentioned earlier, the patient has only refilled her Xanax on 11/25. All of the rest of her medications have been ready for pickup since 11/25, but she has not picked them up although her copay is only $3 to $4 for her medications.   SOCIAL HISTORY: The patient reports that she has cut down smoking. She is smoking 2 to 3 cigarettes per day. She denies any IV drug abuse. She is currently unemployed. She denies any alcohol or IV drug abuse.   FAMILY HISTORY: Positive for diabetes, hypertension, and stroke.   PAST MEDICAL HISTORY:  1. The patient was last admitted to [facility] 07/18 to 07/19 for malignant hypertension, nausea, vomiting, headache related to syncope for malignant hypertension, heat exhaustion, dehydration and inhalation of paint fumes. She was painting a house at that time.  2. Cerebrovascular accident in 03/2012.  3. Dyslipidemia.  4. History of hyperkalemia. 5. Chronic polycythemia due to smoking.  6. Anxiety and depression.  7. Ongoing smoking.  8. Obesity.  9. Insomnia.  10. Recurrent admissions from June to July for malignant hypertension. At that time she  was also found to have subacute cerebrovascular accident. She  was evaluated by a neurologist and had ESR, CRP, ANA, MRA, carotid ultrasound, which were within normal limits.    REVIEW OF SYSTEMS: CONSTITUTIONAL: Denies any fever. Reports fatigue and weakness. EYES: Reports blurred or double vision. Denies any inflammation. ENT: Denies any tinnitus or ear pain. RESPIRATORY: Denies any cough,  wheezing. CARDIOVASCULAR: Denies any chest pain or palpitations. GI: Reports nausea and vomiting. Denies any abdominal pain, diarrhea. GU: Denies any dysuria or hematuria. ENDOCRINE: Denies any polyuria or nocturia. HEMATOLOGIC/LYMPHATIC:  Denies any anemia, easy bruisability. INTEGUMENTARY:  Denies any acne or rash. MUSCULOSKELETAL: Denies any swelling, gout. NEUROLOGICAL: Denies any numbness or weakness. PSYCHIATRIC: Has history of anxiety and depression.   PHYSICAL EXAMINATION:  VITAL SIGNS: Temperature afebrile, heart rate 78, respiratory rate 34, blood pressure 227/137, and pulse oximetry 97% on room air.   GENERAL: The patient is a 54 year old obese female lying in bed. She is in some distress from her headache and nausea.   HEENT: Head: Atraumatic, normocephalic. Eyes: There is no pallor, icterus, or cyanosis. Pupils equal, round, and reactive to light and accommodation. Extraocular movements are intact. ENT: Dry mucous membranes. No oropharyngeal erythema or thrush.   NECK: Supple. No masses. No JVD. No thyromegaly or lymphadenopathy.   CHEST WALL: No tenderness to palpation. Not using accessory muscles of respiration. No intercostal muscle retractions.   LUNGS: Bilaterally clear to auscultation. No wheezing, rales, or rhonchi.   CARDIOVASCULAR: S1, S2 regular. No murmur, rubs, or gallops.   ABDOMEN: Soft. No guarding, no rigidity. No organomegaly. Normal bowel sounds. The patient has some tenderness to palpation in her left lower quadrant.   SKIN: The patient has some redness and facial rash. No other lesions.   PERIPHERIES: No pedal edema, 2+ pedal pulses.   MUSCULOSKELETAL: No cyanosis or clubbing.   NEUROLOGICAL: Awake, alert, oriented x3. Nonfocal neurological exam. Cranial nerves are grossly intact.   PSYCHIATRIC: Normal mood and affect.   LABORATORY, DIAGNOSTIC AND RADIOLOGICAL DATA: CT of the head showed no acute intracranial process. Urine pregnancy test is negative.  Urinalysis showed no evidence of infection. White count 11.5, hemoglobin 16.5, hematocrit 47.9, platelet count 206. Troponin 2.40. Urine drug screen positive for opiates and benzodiazepine. Lipase 597, glucose 161, BUN 13, creatinine 0.59, sodium 139, potassium 4.8, chloride 109, CO2 20, calcium 9.5. LFTs are normal. Magnesium 1.6.   ASSESSMENT AND PLAN: A 54 year old female with history of cerebrovascular accident, malignant hypertension, dyslipidemia, smoking, history of noncompliance, presents with nausea, vomiting, and headache.   1. Malignant hypertension: The patient's blood pressure continues to be elevated. She is having secondary symptoms of nausea, vomiting, blood pressure and headache. It appears that she has not filled her medications or taken her antihypertensive medications for several days. We will admit her to the Intensive Care Unit and start her on a nitroglycerin drip to control her blood pressure better. Hopefully that will also help with her symptoms. We will also start her on the medications that she was discharged on in July because they worked well for her, including clonidine, HCTZ, lisinopril. We will also start her on a beta blocker so that her nitroglycerin drip can be weaned off. Her CT of the head shows no acute abnormality.   2. Possible non ST-elevation myocardial infarction:  The patient's troponin is elevated at 2.4. She denies any chest pain, although her EKG does show some ST-T wave changes. She also reports being short of breath, especially on exertion for some time. Her last echo in  June showed  normal ejection fraction and no wall motion abnormalities. This could be related to malignant hypertension, demand ischemia versus non ST-elevation myocardial infarction. The patient does have risk factors and continues to smoke. I discussed with Dr. Rockey Situ. We will check a stat CK and CK-MB and continue to check serial cardiac enzymes. We will not do any therapeutic anticoagulation  for the time being. We will start her on aspirin, beta blocker, ACE inhibitor, statin, and Lovenox for deep vein thrombosis prophylaxis. She will be on nitroglycerin drip. Polycythemia with elevated hemoglobin: The patient has chronic polycythemia, likely related to ongoing smoking. We will do a 2D echocardiogram to look for any wall motion abnormalities.  3. Mildly elevated white count: Possibly stress reaction. We will continue to monitor.  4. Nonspecific elevation of lipase: The patient had no tenderness to palpation in her epigastrium. We will place her on a clear liquid diet and recheck lipase in the a.m.  5. Left lower quadrant tenderness: The patient does not have any complaints of abdominal pain but on exam was found to have left lower quadrant tenderness. She has had that in the past and had a CT scan of the abdomen in June which was essentially negative. If the patient develops any abdominal symptoms, we will consider doing a CT of the abdomen.  6. Hyperglycemia: This could be reactive. We will check a hemoglobin A1c.  7. Ongoing smoking: The patient was counseled about cessation for more than three minutes.  8. Low magnesium: We will supplement IV since patient is having nausea at present.  9. Anxiety and depression: The patient takes Xanax. It appears that the dose of her Xanax has been increased from what she discharged on in July of 2013. 10. Hyperlipidemia: We will continue statin therapy. We will check a fasting lipid profile.  11. Anxiety and depression: We will continue Xanax and Zoloft.   I reviewed old medical records, discussed with the patient, discussed with Dr. Rockey Situ.   CRITICAL CARE TIME SPENT: 50 minutes.  ____________________________ Cherre Huger, MD sp:cbb D: 09/21/2012 12:00:12 ET T: 09/21/2012 12:40:19 ET JOB#: [id number]  cc: Cherre Huger, MD, <Dictator> [place] MD ELECTRONICALLY SIGNED 09/21/2012 14:09

## 2015-02-05 NOTE — Consult Note (Signed)
General Aspect [name] is a 54 yo with hx of HTN, depressioin,anxiety, smoking with secondary polycythemia who was admitted with severe headache, nausea, and vomitting.    Present Illness This is the 4th hospitalization this year for her.  she has had several other admissions for malignant HTN.  she admits to stopping her Diovan for the past week or so.  She told me that she had been taking some clonidine in place of the Diovan since she ran out.  she was a little vague about how long she had taken the clonidine but I does not sound like she had been taking it for a while and then abruptly DCd it.    She was admitted by medicine and is feeling a bit better.  her cardiac enzymes are elevated and her ECG shows TWI anterior laterally.   she continues to have a headache.  she denies any drug use - in particular cocaine.  still smokes but is cutting back.   Physical Exam:   GEN obese, does not feel well    HEENT PERRL, hearing intact to voice, moist oral mucosa    NECK supple  No masses    RESP normal resp effort  clear BS    CARD Regular rate and rhythm  Normal, S1, S2  S4    ABD positive tenderness  positive Flank Tenderness  normal BS  no Adominal Mass  she was tender to palpitation on her left flank and left lower quadrant.    LYMPH negative neck    EXTR negative cyanosis/clubbing, negative edema    SKIN normal to palpation, No rashes    NEURO cranial nerves intact    PSYCH alert, A+O to time, place, person   Review of Systems:   General: denies fever    Skin: No Complaints    ENT: No Complaints    Eyes: No Complaints    Respiratory: denies cough,    Cardiovascular: denies any chest pain    Gastrointestinal: has abdominal and left flank pain    Vascular: No Complaints    Neurologic: Headache  severe HR for the past several days    Hematologic: No Complaints    Endocrine: Excessive thirst or urination    Review of Systems: All other systems were reviewed and  found to be negative     Anxiety:    Hypertension:   Home Medications: Medication Instructions Status  promethazine 25 mg tablet 1 tab(s) orally every 4 to 6 hours, As Needed- for Nausea, Vomiting  Active  sertraline 50 mg oral tablet 1 tab(s) orally once a day Active  alprazolam 2 mg oral tablet 1 tab(s) orally once a day (at bedtime) Active  alprazolam 1 mg oral tablet 1 tab(s) orally 3 times a day Active  simvastatin 20 mg oral tablet 1 tab(s) orally once a day Active  Strattera 80 mg oral capsule 1 cap(s) orally once a day Active  Diovan 40 mg oral tablet 1 tab(s) orally once a day Active  hydrochlorothiazide-lisinopril 12.5 mg-20 mg oral tablet 1 tab(s) orally once a day Active   Lab Results: Hepatic:  04-Dec-13 04:42    Bilirubin, Total 0.7   Alkaline Phosphatase 75   SGPT (ALT) 17   SGOT (AST) 37   Total Protein, Serum 7.7   Albumin, Serum 3.9  Routine Chem:  04-Dec-13 04:42    Result Comment TROPONIN - NOT AN ADD-ON TEST REORDERED  Result(s) reported on 21 Sep 2012 at 09:30AM.   Result Comment  mg - Slight hemolysis, interpret results with  - caution.  Result(s) reported on 21 Sep 2012 at 07:47AM.   Result Comment potassium/ast - Slight hemolysis, interpret results with  - caution.  Result(s) reported on 21 Sep 2012 at 05:05AM.   Magnesium, Serum  1.6 (1.8-2.4 THERAPEUTIC RANGE: 4-7 mg/dL TOXIC: > 10 mg/dL  -----------------------)   Lipase  597 (Result(s) reported on 21 Sep 2012 at 04:56AM.)   Glucose, Serum  161   BUN 13   Creatinine (comp)  0.59   Sodium, Serum 139   Potassium, Serum 4.8   Chloride, Serum  109   CO2, Serum  20   Calcium (Total), Serum 9.5   Osmolality (calc) 281   eGFR (African American) >60   eGFR (Non-African American) >60 (eGFR values <62m/min/1.73 m2 may be an indication of chronic kidney disease (CKD). Calculated eGFR is useful in patients with stable renal function. The eGFR calculation will not be reliable in acutely ill  patients when serum creatinine is changing rapidly. It is not useful in  patients on dialysis. The eGFR calculation may not be applicable to patients at the low and high extremes of body sizes, pregnant women, and vegetarians.)   Anion Gap 10    09:18    Result Comment TROPONIN - RESULTS VERIFIED BY REPEAT TESTING.  - [place] [facility]  - 09/21/12-DAC  - READ-BACK PROCESS PERFORMED.  Result(s) reported on 21 Sep 2012 at 10:27AM.  Urine Drugs:  [id number]   Tricyclic Antidepressant, Ur Qual (comp) NEGATIVE (Result(s) reported on 21 Sep 2012 at 05:50AM.)   Amphetamines, Urine Qual. NEGATIVE   MDMA, Urine Qual. NEGATIVE   Cocaine Metabolite, Urine Qual. NEGATIVE   Opiate, Urine qual POSITIVE   Phencyclidine, Urine Qual. NEGATIVE   Cannabinoid, Urine Qual. NEGATIVE   Barbiturates, Urine Qual. NEGATIVE   Benzodiazepine, Urine Qual. POSITIVE (----------------- The URINE DRUG SCREEN provides only a preliminary, unconfirmed analytical test result and should not be used for non-medical  purposes.  Clinical consideration and professional judgment should be  applied to any positive drug screen result due to possible interfering substances.  A more specific alternate chemical method must be used in order to obtain a confirmed analytical result.  Gas chromatography/mass spectrometry (GC/MS) is the preferred confirmatory method.)   Methadone, Urine Qual. NEGATIVE  Cardiac:  04-Dec-13 04:42    Troponin I - (0.00-0.05 0.05 ng/mL or less: NEGATIVE  Repeat testing in 3-6 hrs  if clinically indicated. >0.05 ng/mL: POTENTIAL  MYOCARDIAL INJURY. Repeat  testing in 3-6 hrs if  clinically indicated. NOTE: An increase or decrease  of 30% or more on serial  testing suggests a  clinically important change)    09:18    CK, Total  281 (Result(s) reported on 21 Sep 2012 at 12:14PM.)   CPK-MB, Serum  19.6 (Result(s) reported on 21 Sep 2012 at 12:14PM.)   Troponin I  2.40  (0.00-0.05 0.05 ng/mL or less: NEGATIVE  Repeat testing in 3-6 hrs  if clinically indicated. >0.05 ng/mL: POTENTIAL  MYOCARDIAL INJURY. Repeat  testing in 3-6 hrs if  clinically indicated. NOTE: An increase or decrease  of 30% or more on serial  testing suggests a  clinically important change)  Routine UA:  04-Dec-13 05:32    Color (UA) Yellow   Clarity (UA) Clear   Glucose (UA) 50 mg/dL   Bilirubin (UA) Negative   Ketones (UA) 1+   Specific Gravity (UA) 1.015   Blood (UA) Negative  pH (UA) 7.0   Protein (UA) 100 mg/dL   Nitrite (UA) Negative   Leukocyte Esterase (UA) Negative (Result(s) reported on 21 Sep 2012 at 05:58AM.)   RBC (UA) 3 /HPF   WBC (UA) 1 /HPF   Bacteria (UA) NONE SEEN   Epithelial Cells (UA) <1 /HPF   Mucous (UA) PRESENT (Result(s) reported on 21 Sep 2012 at 05:58AM.)  Routine Sero:  04-Dec-13 05:32    Pregnancy Test, Urine NEGATIVE (The results of the qualitative urine HCG (Pregnancy Test) should be evaluated in light of other clinical information.  There are limitations to the test which, in certain clinical situations, may result in a false positive or negative result. Thehigh dose hook effect can occur in urine samples with extremely high HCG concentrations.  This effect can produce a negative result in certain situations. It is suggested that results of the qualitative HCG be confirmed by an alternate methodology, such as the quantitative serum beta HCG test.)  Routine Hem:  04-Dec-13 04:42    WBC (CBC)  11.5   RBC (CBC) 5.09   Hemoglobin (CBC)  16.5   Hematocrit (CBC)  47.9   Platelet Count (CBC) 206   MCV 94   MCH 32.4   MCHC 34.4   RDW 13.4   Neutrophil % 84.1   Lymphocyte % 6.5   Monocyte % 4.1   Eosinophil % 1.4   Basophil % 3.9   Neutrophil #  9.7   Lymphocyte #  0.8   Monocyte # 0.5   Eosinophil # 0.2   Basophil #  0.5 (Result(s) reported on 21 Sep 2012 at 04:56AM.)   EKG:   EKG Interp. by me    Interpretation NSR at  79, ST/T abnormality in leads V4-V6 , worrisome for anterior lateral ischemia.    No Known Allergies:   Vital Signs/Nurse's Notes: **Vital Signs.:   04-Dec-13 13:05   Vital Signs Type Admission   Pulse Pulse 94   Respirations Respirations 32   Systolic BP Systolic BP 915   Diastolic BP (mmHg) Diastolic BP (mmHg) 056   Mean BP 146   Pulse Ox % Pulse Ox % 96   Pulse Ox Activity Level  At rest   Oxygen Delivery 2L   Nurse Fingerstick (mg/dL) FSBS (fasting range 65-99 mg/dL) 134   Comments/Interventions  Per Hyperglycemia Protocol ([facility])    14:00   Pulse Pulse 90   Respirations Respirations 28   Systolic BP Systolic BP 979   Diastolic BP (mmHg) Diastolic BP (mmHg) 480   Mean BP 153   Pulse Ox % Pulse Ox % 99   Oxygen Delivery 2L   Pulse Ox Heart Rate 92    15:00   Pulse Pulse 96   Respirations Respirations 30   Systolic BP Systolic BP 165   Diastolic BP (mmHg) Diastolic BP (mmHg) 537   Mean BP 145   Pulse Ox % Pulse Ox % 95   Oxygen Delivery 2L   Pulse Ox Heart Rate 96     Impression 1. Malignant Hypertension:  Donnia presents with signifiant HTN.  She admits to missing her Diiovan dose.  The dose listed is 40 mg but this is a very low dose of Diovan and I doubt that missing this dose would cause this degree of malignant hypertension.  ? perhaps she is on 240 mg a day. I have some concern that she was atually taking clonidine (prescribed at [facility] at her last admission for malignant HTN) and then stopped  taking it.  This could cause "rebound" hypertension to this degree. I would avoid clonidine in this patient if possible.   I think we need to add / increase beta blocker.  will review med list and make appropriate changes.    2. NSTEMI:  Her enzymes are abnormal .  As mentioned by Dr. Karsten Fells, these may be due to increased stress from the malignant HTN but given the anteriolateral ST changes, I am conerned about a plaque rupture.  Her malignant HTN could also have contributed to a  plaque rupture.  contine NTG for now.  will add Beta blocker.  I think she will need a cath.  I would wait and get her BP better controlled - possibly Friday.  She needs to stop smoking.    Her cardiac enzymes are elevatede   Electronic Signatures: Nahser, Dreama Saa (MD)  (Signed 04-Dec-13 17:24)  Authored: General Aspect/Present Illness, History and Physical Exam, Review of System, Past Medical History, Home Medications, Labs, EKG , Allergies, Vital Signs/Nurse's Notes, Impression/Plan   Last Updated: 04-Dec-13 17:24 by Nahser, Dreama Saa (MD)

## 2015-02-05 NOTE — Discharge Summary (Signed)
PATIENT NAME:  [name], [name] MR#:  [id number] DATE OF BIRTH:  1961/09/28  DATE OF ADMISSION:  05/05/2012 DATE OF DISCHARGE:  05/06/2012  DISCHARGE DIAGNOSES:  1. Malignant hypertension.  2. Headache, nausea, vomiting, possible syncope due to malignant hypertension/heat exhaustion/dehydration/inhalation of paint fumes.  3. History of prior cerebrovascular accident.   4. Dyslipidemia.  5. Hypokalemia.  6. Chronic polycythemia possibly due to smoking. 7. Anxiety, depression. 8. Ongoing smoking.   DISPOSITION: Patient is being discharged home.  FOLLOW UP: Follow with [facility] as scheduled.   DIET: Low sodium.   ACTIVITY: As tolerated.   DISCHARGE MEDICATIONS:  1. Norco 325/5 mg 1 tablet q.4 hours p.r.n.  2. Phenergan 25 mg q.6 hours p.r.n.  3. Simvastatin 10 mg daily.  4. Clonidine 0.1 mg b.i.d.  5. Aspirin 325 mg daily.  6. HCTZ/lisinopril 12.5/20, 1 tablet b.i.d.  7. Xanax 0.5 mg q.6 hours p.r.n.  8. Zoloft 100 mg daily.   LABORATORY, DIAGNOSTIC, AND RADIOLOGICAL DATA: CT of the head showed no acute abnormalities. Urine pregnancy test negative. White count 11.9 on admission, 7.4 by the time of discharge. Hemoglobin 17.3 on admission, 15.8 by the time of discharge. Normal platelet count. Minimally elevated troponin 0.07. Normal TSH. Normal LFTs. Normal BMP other than low potassium which was supplemented.   HOSPITAL COURSE: Patient is a 54 year old female with past medical history of hypertension, cerebrovascular accident, anxiety, depression, chronic polycythemia who presented with headache, nausea, vomiting, and possible syncope after spending the day painting a house. She was found to have malignant hypertension on admission. She was restarted on her home medications with good hypertensive control. Her headache, nausea, vomiting, possible syncope was felt to be due to a combination of malignant hypertension/inhalation of pain fumes plus/minus heat exhaustion and dehydration. CT  head was negative. Her TSH was normal. Her symptoms had completely resolved by the time of discharge. She has chronic polycythemia, possibly due to smoking. She had hypokalemia which was supplemented. She was extensively counseled about smoking cessation. During the hospitalization patient repeatedly requested nausea and pain medications without having any signs of distress. She was counseled about medication compliance and discharged home in a stable condition.   TIME SPENT: 45 minutes.   ____________________________ Darrick MeigsSangeeta Gurpreet Mariani, MD sp:cms D: 05/06/2012 15:37:05 ET T: 05/09/2012 09:09:17 ET JOB#: [id number]  cc: Darrick MeigsSangeeta Kaylum Shrum, MD, <Dictator> [facility]  Darrick MeigsSANGEETA Rishab Stoudt MD ELECTRONICALLY SIGNED 05/14/2012 7:26

## 2015-02-08 NOTE — Consult Note (Signed)
PATIENT NAME:  [name], [name] MR#:  [id number] DATE OF BIRTH:  Jan 31, 1961  DATE OF CONSULTATION:  10/23/2012  REFERRING PHYSICIAN:   CONSULTING PHYSICIAN:  Scot Junobert T. Ricky Doan, MD  HISTORY OF PRESENT ILLNESS:  [facility]. She had a very elevated troponin level and she was admitted to the hospital.  In early December she had an N-STEMI and malignant hypertension. She had a heart catheterization that showed a 50% right coronary lesion, 20% LAD so it appears that with her and troponin elevated again this time, she has had another non-ST elevated MI.   The patient has had vomiting for about 36 hours several times. She also has a deep cough.  The coughing started about a day ago. She has vomited about 6 times since this morning.  The nurse reported that last vomitus was about 50 mL of mucousy, blackish material with tinges of red blood.  PAST MEDICAL HISTORY: 1.  Coronary artery disease, recent history of N-STEMI. 2.  Cardiomyopathy with ejection fraction 35%.  3.  Hypertension.  4.  Polycythemia.  5.  Hyperlipidemia.  6.  Tobacco abuse.  7.  Agoraphobia for 25 years.  8.  Anxiety.   PAST SURGICAL HISTORY: No surgery. No abdominal surgery.   HABITS: Smokes 2 to 3 packs a day most of her life, down to 1/2 pack in the last few days. No history of alcohol abuse.   FAMILY HISTORY: Positive for hypertension, diabetes and strokes.   ALLERGIES: No known drug allergies.   MEDICATIONS:  Zoloft 50 mg a day, Xanax 1 mg t.i.d. and 2 mg at bedtime, Strattera 80 mg a day, simvastatin 40 mg a day, hydrochlorothiazide/lisinopril 12.5/20 mg b.i.d., Coreg 6.25 mg b.i.d., aspirin 325 mg a day. The patient has a history of hypertension for 13 years.   Two years ago she supposedly weighed 400 pounds and she has lost the weight on her own without bypass surgery.  While I was in the room interviewing her and examining her, there was no vomiting. She had several spells of deep  coughing.  PHYSICAL EXAMINATION: GENERAL: Mildly obese white female with deep cough, occasional heaving, somewhat anxious, but in no acute distress.  HEENT: Sclerae anicteric. Conjunctivae negative. Tongue is negative.  HEAD: Atraumatic.  CHEST: Clear in the anterior fields. Did not examine posterior fields.  HEART: No murmurs, gallops, clicks or rubs that I can hear.  ABDOMEN: Mild diffuse tenderness especially epigastric and right upper quadrant. No hepatosplenomegaly. No masses. No bruits.  EXTREMITIES: No edema.  SKIN: Warm and dry.  PSYCHOLOGICAL:  She appears to be anxious, needing medication for anxiety and nausea, at first, difficult to talk to.  This improved with the interview.   LABORATORY AND DIAGNOSTIC DATA:   BUN 29, creatinine 1.65. Troponin was 8 on admission.  Blood pressure this morning was 125/80. Her diastolic is labile and recorded as high as 120.  Potassium 2.7, glucose 116, BUN 33, creatinine 1.39, sodium 144, calcium 8.7. Troponin yesterday was 5.3, today is 3.   ASSESSMENT:  She does take aspirin and she does smoke and she does have a lot of stress so it is quite possible she has ulcer disease or gastritis or duodenitis.  In her Review of Systems she talks about some difficulty with swallowing, but it appears to be more related to sensations in her throat.  Since she has some hematemesis on heparin she is at risk for serious bleeding, so I would  keep her  heparin as low as possible.  She could possibly have erosions from vomiting. She could have Mallory-Weiss tears from vomiting and these could bleed more with heparin.  Of course, she needs the heparin because of the heart attack, so we are kind of in between conflicting problems.  At this time, I see no indication for endoscopy or gastric emptying study and  we will cancel the gastric emptying study.  The main things to control at this time are her anxiety and her vomiting and keep tabs on her hemoglobin.   I will follow  with you.     ____________________________ Scot Jun, MD rte:ct D: 10/23/2012 13:34:39 ET T: 10/23/2012 14:19:41 ET JOB#: [id number]  cc: Scot Jun, MD, <Dictator> Kym Groom, MD Scot Jun MD ELECTRONICALLY SIGNED 11/06/2012 9:42

## 2015-02-08 NOTE — Discharge Summary (Signed)
PATIENT NAME:  [name], Sameerah MR#:  [id number] DATE OF BIRTH:  06-Jan-1961  DATE OF ADMISSION:  09/21/2012 DATE OF DISCHARGE:  09/23/2012  DISCHARGE DIAGNOSES:  1. Malignant hypertension. 2. Non-ST-elevation myocardial infarction due to stress induced cardiomyopathy.  3. Hyperlipidemia.  4. Chronic polycythemia.  5. Leukocytosis.  6. Nonspecific elevation of lipase.  7. Hypokalemia.  8. Hyperglycemia.  9. Smoking. 10. History of noncompliance. 11. Anxiety and depression. 12. Insomnia. 13. Hypomagnesemia.   DISPOSITION: The patient is being discharged home. [facility]. Followup with Dr. Lorine BearsMuhammad Arida within a week.   DIET: Low sodium.   ACTIVITY: As tolerated.   DISCHARGE MEDICATIONS:  1. Zoloft 50 mg daily.  2. Xanax 1 mg three times daily. 3. Strattera 80 mg daily.  4. Xanax 2 mg at bedtime.  5. Promethazine 25 mg every 4 to 6 hours p.r.n. nausea and vomiting. 6. Simvastatin 40 mg daily. 7. HCTZ/lisinopril 12.5/20 mg twice a day. 8. Coreg 6.25 mg twice a day. 9. Aspirin 325 mg daily.   CONSULTANTS: Lorine BearsMuhammad Arida, MD - Cardiology.  LABS/RADIOLOGIC STUDIES: Echo showed ejection fraction 50% to 55%, pseudonormalization, moderate concentric left ventricular hypertrophy, mild apical hypokinesis. Left ventricular systolic function is normal.   Cardiac catheterization: No angiographic evidence of occlusive coronary artery disease, mild to moderate three vessel coronary artery disease. There is severe anterolateral hypokinesis and severe apical hypokinesis. Global left ventricular systolic function was moderately depressed. Ejection fraction 35%. Pattern is suggestive of stress-induced cardiomyopathy. Recommend treatment with beta blocker and ACE inhibitor.   Urine pregnancy test negative. Hemoglobin 16.5 to 17, white count 11.5 to 13.4, and platelet count normal. Troponin peaked at 5. BUN 13, creatinine 0.59, sodium 139,  potassium 4.8, chloride 109, CO2 20. VLDL 26, LDL 124, cholesterol 211, triglycerides 128, and HDL 61. Lipase 597 and normal by the time of discharge. Urine drug screen positive for benzodiazepines and opiates.   HOSPITAL COURSE: The patient is a 54 year old female with history of recurrent malignant hypertension, history of noncompliance, cerebrovascular accident June 2013, hyperlipidemia, and chronic polycythemia due to smoking who presented with nausea, vomiting, and headache. She was found to have malignant hypertension and was initially admitted to the Intensive Care Unit and started on a nitroglycerin drip. She had to be switched to nitroprusside drip because her blood pressure was not adequately controlled with nitroglycerin. She also had a NSTEMI with peak troponin of 5. She underwent catheterization by Dr. Kirke CorinArida which showed that she had an ejection fraction of 35%. Her prior ejection fraction was normal. She had mild to moderate coronary artery disease, but as a result of stress-induced cardiomyopathy she also had wall motion abnormalities. She was treated for hypertension as well as NSTEMI. She is currently on aspirin, beta blocker, ACE inhibitor, diuretic, and statin. Her LDL is elevated at 124. Therefore, the dose of her cholesterol medication was increased. The patient had nonspecific elevation of          her lipase which normalized spontaneously. She has been extensively counseled about smoking and medication compliance. She had mild hypokalemia and hypomagnesemia that was supplemented. She is being discharged home in a stable condition.   TIME SPENT: 45 minutes.  ____________________________ Darrick MeigsSangeeta Crosley Stejskal, MD sp:slb D: 09/23/2012 14:21:34 ET T: 09/23/2012 14:39:19 ET JOB#: [id number]  cc: Darrick MeigsSangeeta Alexxus Sobh, MD, <Dictator> Darrick MeigsSANGEETA Sully Dyment MD ELECTRONICALLY SIGNED 10/27/2012 21:13

## 2015-02-08 NOTE — Discharge Summary (Signed)
PATIENT NAME:  [name], [name] MR#:  [id number] DATE OF BIRTH:  [birth year]  DATE OF ADMISSION:  09/03/2013 DATE OF DISCHARGE:  09/03/2013  DISCHARGE DIAGNOSES:  1.  Malignant hypertension.  2.  Pneumonia.  3.  Hypertension.  4.  Polycythemia.  5.  Anxiety.  6.  Smoker.  7.  Coronary artery disease.   CONDITION ON DISCHARGE: Stable.   CODE STATUS: FULL CODE.   MEDICATIONS ON DISCHARGE:  1.  Aspirin 81 mg two tablets once a day.  2.  Carvedilol 12.5 mg two times a day.  3.  Pantoprazole 40 mg two times a day.  4.  Sertraline 50 mg once a day.  5.  Simvastatin 40 mg once a day.  6.  Amlodipine 10 mg once a day.  7.  Hydrochlorothiazide 25 mg once a day.  8.  Ondansetron 4 mg every 8 hours as needed.  9.  Lisinopril 20 mg once a day.  10.  Alprazolam 0.5 mg two times a day as needed for anxiety.  11.  Ceftin 500 mg two times a day for 5 days.   ACTIVITY: As tolerated.   FOLLOWUP: Advised to follow within 1 to 2 weeks with PMD and cardiologist. Also advised to work up for secondary hypertension like pheochromocytoma and other causes as outpatient. She was already following with Dr. Kirke Corin and Dr. Lacie Scotts in office so will continue to follow up and also advised to follow with hematologist in office for polycythemia.   HISTORY OF PRESENT ILLNESS: A 54 year old female with past medical history of coronary artery disease, status post non-ST-elevation MI, hypertension, polycythemia, hyperlipidemia, multiple other medical problems who presented to hospital with headache, nausea and vomiting. The patient was reporting that she has chronic history of migraine headache, but the morning before, she woke up with shooting headache going to the back of her head with nausea and vomiting. Denied any dizziness or chest pain. She could not sleep and had severe photophobia. Pain was 10 out of 10. Denied any blurry vision, but she noticed some black spots in front of her eyes sometimes. Blood pressure was  226/115 and after getting lisinopril and hydrochlorothiazide, there was a drop in the blood pressure to 176/101, but then it went up again and she was admitted to critical care unit for further management.   HOSPITAL COURSE AND STAY:  1.  She was initially started on IV nicardipine drip, but then after a few hours, blood pressure came under control and her headache resolved, so she was off the medication. She had multiple episodes like this in the past and was under workup with Dr. Kirke Corin and Dr. Carron Brazen office, so after her episode got resolved, she was willing to go home as then she can continue the workup with her primary care physician. I agreed with her and discharged her home.  2.  Right lower lobe pneumonia. She was on Rocephin plus azithromycin and I gave her cefuroxime on discharge.  3.  History of polycythemia. Hemoglobin was high. Advised to follow CBC with primary care physician and follow a hematologist in the office.  4.  History of coronary artery disease and cardiomyopathy with ejection fraction 35%. There was no exacerbation symptoms, and she was following with Dr. Kirke Corin, so I advised to continue the same.   IMPORTANT LABORATORY RESULTS IN THE HOSPITAL: BUN 16, creatinine 0.47, sodium 138, potassium 4.1, chloride 106 and calcium was 10 on admission. WBC 14.4. Hemoglobin was 18.6 and platelet count was 270. Troponin was  less than 0.02.   Chest x-ray, portable, on admission with right lower lobe infiltrate.   Urinalysis was negative.   CT of the head without contrast: No acute intracranial process.   TOTAL TIME SPENT ON THIS DISCHARGE: 35 minutes.   ____________________________ [name], MD vgv:np D: 09/04/2013 16:32:00 ET T: 09/04/2013 19:30:20 ET JOB#: [id number]  cc: [name], MD, <Dictator> Altamese DillingVAIBHAVKUMAR Daeshaun Specht MD ELECTRONICALLY SIGNED 09/18/2013 10:10

## 2015-02-08 NOTE — Discharge Summary (Signed)
PATIENT NAME:  [name], [name] DATE OF BIRTH:  [birth year]  DATE OF ADMISSION:  10/21/2012 DATE OF DISCHARGE:  10/25/2012  REASON FOR ADMISSION:  Uncontrolled nausea and vomiting.   FINAL DIAGNOSES:   1.  Non-ST-elevation myocardial infarction.  2.  Chronic nausea and vomiting due to severe anxiety and agoraphobia, now on Reglan.  3.  Malignant hypertension with hypertensive emergency.  4.  Hyperlipidemia.  5.  Chronic polycythemia.  6.  Leukocytosis.  7.  Hypokalemia.  8.  Hyperglycemia.  9.  Ongoing smoking.  10.  Hypomagnesemia.  11.  Anxiety and depression.  12.  Stress-related cardiomyopathy with ejection fraction of 35% on past admission, now ejection fraction has normalized.  13.  Agoraphobia.  14.  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 The patient has been recommended to follow up on this issue with her primary care physician. Test has been collected. The patient really wants to go today, cannot be stopped.  15.  Proteinuria.  16.  Lactic acidosis.  17.  Acute kidney injury, resolved.   MEDICATIONS AT DISCHARGE:   1.  Sertraline 50 mg once a day.  2.  Strattera 80 mg once a day.  3.  Hydrochlorothiazide with lisinopril 12.5 mg/20 mg 2 times daily.  4.  Simvastatin 40 mg once a day.  5.  Promethazine25 mg suppository p.r.n.  6.  Promethazine tablets, also p.r.n.  7.  Aspirin 81 mg tablets take 2 once a day.  8.  Coreg 12.5 mg twice daily.  9.  Lisinopril 10 mg twice daily; this is in addition to the lisinopril 20 mg twice daily for a total of 30 mg twice daily.  10.  Isosorbide mononitrate 30 mg once a day.  11.  Metoclopramide 5 mg 3 times a day.  12.  Lorazepam 1 mg every 8 hours and 2 mg at bedtime.  13.  Potassium chloride 20 mEq once a day.  14.  Protonix 40 mg twice daily.  15.  Amlodipine 2.5 mg once a day.   DISPOSITION:  Home.   FOLLOWUP:   1.  With Dr. Lorine BearsMuhammad Arida at [facility] in 5 to 7 days.  2.  With primary care physician  at [facility].   CONSULTANTS:   1.  Dr. Lorine BearsMuhammad Arida.  2.  Dr Lynnae Prudeobert Elliott.    IMPORTANT RESULTS:   1.  Glucose was 116 on admission and normalized after that. Her creatinine was 1.39 and at discharge it is 0.85.  2.  Severe hypokalemia with potassium of 2.7 at admission; at discharge is 3.4.  3.  Chloride 104.  4.  Magnesium 1.5 was the lowest.  5.  Troponin 0.58 at discharge and 8.09 on admission.  6.  UDS positive for benzos.  7.  White count 25,000, hemoglobin 20, platelets 253.  8.  UA with 11 white blood cells, 5 red blood cells. No fever, no symptoms.  9.  Lactic acid 2.3.  10.  EKG: Sinus tachy.   HOSPITAL COURSE:  The patient is a very nice 54 year old female with history of being admitted back in December with a non-ST-elevation MI and malignant hypertension. She had a heart catheterization that showed 50% stenotic lesion of the RCA. The patient had the 50% in the RCA and 20% on the LAD and circumflex lesions, but no significant management could be done as far as stents; medical treatment recommended. The patient apparently had all of this due to stress-related cardiomyopathy. She is very anxious. She has agoraphobia. At that  moment, her ejection fraction was 35%. The patient comes to the ER with history of increased nausea and vomiting. The patient gets very anxious due to her agoraphobia and apparently these 2 MIs in a row have been related to increased anxiety and not able to take her medications. As far as her non-ST-elevation MI, the patient was already taking aspirin and was already taking a beta-blocker and a statin, although she was not able to ingest these medications due to her severe nausea. She has been having a lot of things going on in her house. She comes at this time with increased nausea and vomiting. Never had chest pain and never had increased shortness of breath, but she was found out to have a troponin of above 8. The patient was put on  anticoagulation on the floor and she did okay. There were no significant complications. There was no significant worsening during this hospitalization.   The patient was seen by Dr. Kirke Corin who knows her case very well. He recommended at least 48 hours of heparin, for which the patient was on a heparin drip. Unfortunately, due to severe hypertension, the heparin drip needed to be stopped for several hours, but there were no signs of increase in the size of the MI. After her blood pressure was better controlled, the drip restarted and thn after >48hrs was stopped the day prior to discharge and changed for prophylactic dose of heparin and aspirin.   Echocardiogram was repeated due to history of cardiomyopathy, stress related, and at this moment, her EF improved to above 55. The patient was recommended to continue to take all her medications that are given at discharge.   1.  Acute renal failure due to dehydration, nausea and vomiting. The patient had this corrected with IV fluids.  2.  Hypokalemia due to decreased oral intake and also loss of GI fluids. The patient was given a prescription to take home.  3.  Hypertension. The patient has significant history of hypertension. She developed a hypertensive emergency and was transferred to the Critical Care Unit on a nitroprusside drip.  Her heparin was stopped due to potential of bleeding and restarted once her systolics were below 180.  4.  Thrombocytopenia. The patient developed thrombocytopenia on the day of discharge. The patient really wants to be discharged. There is no way I can leave her over here 1 more day if she wants to recheck her labs with her PCP it will be alright.  I recommended to follow up with her primary care physician. At this moment, we are stopping heparin and low-molecular heparin and a HIT antibody has been sent.  5.  Other medical problems were stable during this hospitalization. The patient is discharged in a safe condition.    DISCHARGE TIME:  Over 40 minutes.    ____________________________ Felipa Furnace, MD rsg:si D: 10/25/2012 15:42:00 ET T: 10/25/2012 21:31:33 ET JOB#: [id number]  cc: Jerolyn Center A. Kirke Corin, MD [facility] Scot Jun, MD Felipa Furnace, MD, <Dictator>     Rashika Bettes Juanda Chance MD ELECTRONICALLY SIGNED 10/28/2012 13:45

## 2015-02-08 NOTE — H&P (Signed)
PATIENT NAME:  [name], [name] MR#:  [id number] DATE OF BIRTH:  07/20/1961  DATE OF ADMISSION:  09/03/2013  PRIMARY CARE PHYSICIAN: [facility], Dr. Lacie Scotts. REFERRING PHYSICIAN: Dr. Carollee Massed.   CHIEF COMPLAINT: Headache, nausea, vomiting.   HISTORY OF PRESENT ILLNESS: The patient is a 54 year old female with a past medical history of coronary artery disease, status post non-STEMI, hypertension polycythemia, hyperlipidemia and multiple other medical problems is presenting to the ER with a chief complaint of headache, nausea and vomiting. The patient is reporting that she has chronic history of migraine headaches, but yesterday morning, she woke up shooting headache in the back of her head associated with nausea and vomiting. Denies any dizziness or chest pain. No shortness of breath either. The pain was so severe that the patient could not sleep. Denies any photophobia. Pain is 10 out of 10, and she does not think it is similar to her migraine headaches. The patient is under a lot of stress and feeling very anxious during my encounter. She is reporting that she might lose her job and she is so much worried about it. Denies any blurry vision. Noticing black spots in front of her eyes sometimes. The patient denies any loss of consciousness. In the ER, the patient was given Dilaudid IV injections 2 times, one dose of her home medication lisinopril and hydrochlorothiazide with a temporary drop in the blood pressure from 226/115 to 176/101, but subsequently it shot up to 220/128. The patient was started on a drip to titrate her systolic blood pressure down to 170 to 180. No family members are at bedside. The patient denies any other complaints.   PAST MEDICAL HISTORY: Hypertension polycythemia, hyperlipidemia, tobacco use, agoraphobia and anxiety, cardiomyopathy with an ejection fraction of 55% according to the old records, coronary artery disease with recent non-STEMI.  PAST SURGICAL HISTORY:  None.  ALLERGIES:  No known drug allergies.   PSYCHOSOCIAL HISTORY: Lives alone at home. Continues to smoke. Denies any alcohol intake or illicit drug use.   FAMILY HISTORY: Hypertension and diabetes mellitus  .   HOME MEDICATIONS: Simvastatin 40 mg p.o. at bedtime, Sertraline 50 mg p.o. once daily, promethazine 25 mg p.o. q.6h. p.r.n., lisinopril 10 mg once daily, amlodipine 10 mg once daily, alprazolam 2 mg half tablet orally once a day.   REVIEW OF SYSTEMS: The patient denies any fever. Complaining of fatigue.  EYES: Denies blurry vision, double vision, but complaining of black spots intermittently.  EARS, NOSE, THROAT: No epistaxis, discharge.  RESPIRATION: Denies cough, chronic obstructive pulmonary disease.  CARDIOVASCULAR: No chest pain or palpitations.  GASTROINTESTINAL: Denies nausea, vomiting, or diarrhea. No melena or hematochezia.  GENITOURINARY: No dysuria. ENDOCRINE: Denies heat or cold intolerance.  INTEGUMENT: No acne, rash, lesions.  MUSCULOSKELETAL: No joint effusion, tenderness. NEUROLOGICAL: No vertigo or ataxia.  PSYCHIATRIC: Normal mood and affect.   PHYSICAL EXAMINATION:   VITAL SIGNS: Pulse 80, respirations 16, blood pressure 190/94, pulse oximetry 99%.  GENERAL APPEARANCE: Not in acute distress. Moderately built and nourished.  HEENT: Normocephalic, atraumatic. Pupils are equal, reacting to light and accommodation. No scleral icterus. No conjunctival injection. No nystagmus.  OROPHARYNX: Clear. Moist mucous membranes.  NECK: Supple. No JVD. No thyromegaly. No lymphadenopathy.  LUNGS: Clear to auscultation bilaterally. Moderate air entry. No crackles, no wheezing. CARDIAC: S1, S2 normal,  regular rate and rhythm. No murmurs.   GASTROINTESTINAL: Soft, bowel sounds are positive in all four quadrants. Nontender, nondistended with no hepatosplenomegaly. No masses.  NEUROLOGICAL: Awake, alert and oriented  x 3. Cranial nerves II through XII are grossly intact.   EXTREMITIES: No edema. No cyanosis. No clubbing.  SKIN: Moist, normal turgor. No rashes. No lesions.   LABORATORY AND IMAGING STUDIES: Accu-Chek is 130, BMP: Glucose 138, BUN 16 , sodium 138, potassium 4.1, chloride 106, CO2 21, GFR greater than 60. Anion gap 11 . Calcium 10, magnesium 1.7. LFTs are normal except AST is slightly elevated at 45  . WBC 14.4, hemoglobin 18.6, hematocrit is 52.2. EKG: Sinus bradycardia, left axis deviation, anterior septal infarct of undetermined age. CT head: No acute findings. Chest x-ray, portable view has revealed right lower lobe infiltrate. Follow-up is recommended, cardiomegaly.   ASSESSMENT AND PLAN: An 54 year old female presenting to the ER with a chief complaint of headache, nausea, vomiting will be admitted with the following assessment and plan:  1. Malignant hypertension with headache and intermittent episodes of black spots. We will admit the patient to Critical Care Unit. Drip is ordered to titrate systolic blood pressure to 170s. 2. We will resume home medications and uptitrate.  3. Right lower lobe pneumonia.  The patient will be on antibiotics and blood cultures were obtained. Sputum culture is ordered, which is pending at this time.  4. Anxiety. We will provide her Ativan as needed basis.  5. History of polycythemia. Monitor CBC,  6. History of coronary artery disease and cardiomyopathy with an ejection fraction of 35%.  Resume home medication including aspirin, statin.  7. Provide gastrointestinal and deep vein thrombosis prophylaxis.   THE PATIENT IS FULL CODE.   Sister is the medical power of attorney.     ____________________________ Ramonita LabAruna Villa Burgin, MD ag:sg D: 09/03/2013 05:31:00 ET T: 09/03/2013 08:11:41 ET JOB#: [id number]  cc: Ramonita LabAruna Malissa Slay, MD, <Dictator> Meindert A. Lacie ScottsNiemeyer, MD  Ramonita LabARUNA Yaeli Hartung MD ELECTRONICALLY SIGNED 09/14/2013 7:19

## 2015-02-08 NOTE — Consult Note (Signed)
PATIENT NAME:  [name], [name] MR#:  [id number] DATE OF BIRTH:  [birth year]  DATE OF CONSULTATION:  10/21/2012  REFERRING PHYSICIAN:   CONSULTING PHYSICIAN:  Pricilla Riffle, MD  IDENTIFICATION:  The patient is a 54 year old woman who was admitted about a month ago to [facility] for malignant hypertension. She suffered an N-STEMI with a peak troponin of 5.  She was seen by Dr. Kirke Corin.  Cardiac catheterization was done that showed a 50% RCA lesion, 20% LAD and circumflex lesions. Note there was severe anterolateral and severe apical hypokinesis with an LVEF of 35%.  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 She was placed on a beta blocker and ACE inhibitor and discharged home.   The patient reports that since discharge, she has continued to feel poorly. She is weak and has no energy. She has intermittent nausea, vomiting, has some good days where this is better and other days where she cannot keep her medicines down. Today, was not significantly different from other days; she just needed to find out what was going on and came to the Emergency Room. Per the hospital's reports she did note a low grade temperature.   ALLERGIES:  NO KNOWN DRUG ALLERGIES.  PAST MEDICAL HISTORY:  1.  History of N-STEMI with mild coronary artery disease December 2013. LVEF of 35%. 2.  Hypertension, malignant.  3.  Polycythemia. 4.  Dyslipidemia. 5.  Tobacco abuse. 6.  Agoraphobia.  7.  History of CVA June 2013.   8.  History of anxiety.   FAMILY HISTORY: Significant for hypertension, diabetes, CVA.   SOCIAL HISTORY: The patient continues to smoke. She denies alcohol or drug use.   REVIEW OF SYSTEMS:  All systems reviewed and negative to the above problem except as noted above.   PHYSICAL EXAMINATION:  GENERAL:  On exam, the patient is in no acute distress though she does note some nausea.  VITAL SIGNS:  Blood pressure on admission was  169/100, currently 104 systolic, pulse on arrival to the Emergency Room was 127, now 84 (sinus rhythm), temperature in the Emergency Room was 100.  HEENT:  Normocephalic, atraumatic. EOMI. PERRL.  NECK:  JVP is normal. No lymphadenopathy. No thyromegaly. No bruits.  LUNGS:  Relatively clear without rales or wheezes. Moving air.  CARDIAC:  Regular rate and rhythm.  S1 and S2. No S3, S4 or significant murmurs.  ABDOMEN:  Supple. There is right-sided tenderness. No masses. No rebound. Normal bowel sounds.  EXTREMITIES: Good distal pulses throughout. No lower extremity edema.  NEUROLOGIC:  Cranial nerves II through XII are grossly intact. Moving all extremities.   LABORATORY AND DIAGNOSTIC DATA:   Significant for creatinine 1.65, potassium 3. CK total of 421 with an MB fraction of 23, troponin of 8.1. Urinalysis positive for benzodiazepines. WBC increased to 25.8, hemoglobin increased at 20.3 with a hematocrit elevated at 58.2, platelet count 253.  BUN 29.  Influenza screen negative.  Urinalysis:  Cloudy, amber, specific gravity of 1.003, protein greater than 500.    12-lead EKG shows sinus tachycardia at 120 beats per minute. Inferior wall MI, septal MI.  IMPRESSION:   1.  The patient is a 54 year old who presents with continued nausea, vomiting and feeling poorly since she was discharged from the hospital about 1 month ago. On last admission, she ruled in for myocardial infarction (non-ST elevation myocardial infarction), had mild coronary artery disease, but significant wall motion abnormalities as noted.   On examination  today, she was originally tachycardic.  Her heart rate is improved on medication therapy. Blood pressure is improved on medication therapy. Otherwise, volume status looks good.   laboratories are significant for a troponin and CK-MB that are elevated.  Also elevated significantly is her hemoglobin which seems out of proportion to the degree of elevation in her BUN and creatinine.  Concern for polycythemia. I will need to review previous laboratories.  For now I agree with current plans of resuming her outside medicines, beta blocker and ACE inhibitor.  Again with her chronic nausea, vomiting, compliance with beta blocker therapy may be difficult and we may see some reflection of rebound. She is currently being treated with Phenergan for nausea, vomiting.   I would add low dose calcium channel blocker and cut back on her ACE inhibitor some. This may help in relation to spasm of the vessels, which may be playing a part.   Of concern is her hemoglobin elevation, question if some of this may represent some sludging in small vessels.  This elevation seems out of proportion to the degree of dehydration.   2.  Hypertension. Follow on medical therapy.  3.  Tobacco use, will need continued counseling.  4.  Hyperlipidemia. This patient has been maintained on simvastatin. Would continue.     ____________________________ Pricilla RifflePaula V. Ross, MD pvr:ct D: 10/21/2012 22:55:00 ET T: 10/22/2012 08:40:37 ET JOB#: [id number]  cc: Pricilla RifflePaula V. Ross, MD, <Dictator> Pricilla RifflePAULA V ROSS MD ELECTRONICALLY SIGNED 02/09/2013 16:15

## 2015-02-08 NOTE — Consult Note (Signed)
PATIENT NAME:  Susan DenisHENSON, [name] MR#:  [id number] DATE OF BIRTH:  17-Aug-1961  DATE OF CONSULTATION:  10/21/2012  REFERRING PHYSICIAN:   CONSULTING PHYSICIAN:  Doylene CanningGregg W. Ladona Ridgelaylor, MD  NO DICTATION    ____________________________ Doylene CanningGregg W. Ladona Ridgelaylor, MD gwt:ct D: 10/21/2012 22:46:00 ET T: 10/22/2012 08:39:05 ET JOB#: [id number]  cc: Doylene CanningGregg W. Ladona Ridgelaylor, MD, <Dictator>

## 2015-02-08 NOTE — Consult Note (Signed)
Pt consulted for hematemesis.  H & P done and dictated, discussed with Dr. Kirke CorinArida,  severely ill, recurrent NSTWMI based on troponins.  Some hematemesis, high risk for PUD given ASA and heavy smoking and very stressful patient.  Cancel gastric empty study. Empiric PPI, alternate phenergan and Zofran, Ativan iv, change to oral when vomiting quits.  Probably do UGI or EGD later,  Very complex patient, I will follow with you.  Electronic Signatures: Scot JunElliott, Sajad Glander T (MD)  (Signed on 05-Jan-14 13:48)  Authored  Last Updated: 05-Jan-14 13:48 by Scot JunElliott, Jadie Comas T (MD)

## 2015-02-08 NOTE — Consult Note (Signed)
PATIENT NAME:  [name], [name] MR#:  [id number] DATE OF BIRTH:  Nov 26, 1960  DATE OF CONSULTATION:  10/21/2012  CONSULTING PHYSICIAN:  Scot Junobert T. Elliott, MD  ADDENDUM:  Her troponin today is down to 0.82 from a high of 8.09 two days ago. Her drug screen is negative except for benzodiazepines. Her white blood count is down to 10, hemoglobin 14.3, platelet count 121. She is on heparin and her PTT is 80 today, down from 104 yesterday.   Her liver panel shows a bilirubin of 1.1, albumin 4.7, alkaline phosphatase 93, SGOT 46, SGPT 19.  BUN 2 days ago was 29, today is 21, creatinine 1.65 on admission and today is 1.06.  Potassium of 3.1. Chest x-ray shows no evidence of acute pulmonary disease.     ____________________________ Scot Junobert T. Elliott, MD rte:ct D: 10/23/2012 13:37:59 ET T: 10/23/2012 14:41:54 ET JOB#: [id number]  cc: Scot Junobert T. Elliott, MD, <Dictator>

## 2015-02-08 NOTE — Consult Note (Signed)
Pt had severe HTN and moved to unit, now back on floor.  She is sipping soda and not having vomiting or hematemesis.  She has a very labile affect and teared up when I asked if anyone would be bringing food from the outside, said she didn't think anyone would come to visit her.  No abd pain, no vomiting.  No new recommendations.  Would titrate high on anti anxiety medication.  No endoscopic procedures planned.  Electronic Signatures: Scot JunElliott, Robert T (MD)  (Signed on 06-Jan-14 16:48)  Authored  Last Updated: 06-Jan-14 16:48 by Scot JunElliott, Robert T (MD)

## 2015-02-08 NOTE — H&P (Signed)
PATIENT NAME:  [name], [name] MR#:  [id number] DATE OF BIRTH:  May 19, 1961  DATE OF ADMISSION:  10/21/2012  PRIMARY CARE PHYSICIAN: Dr. Kym Groomheryl Linney   CARDIOLOGIST: Dr. Rachel MouldsMohammad Arida.   CHIEF COMPLAINT: Nausea and vomiting.   HISTORY OF PRESENT ILLNESS: This is a 54 year old female who was admitted early in December with a NSTEMI MI and malignant hypertension at that time. Heart cath at that time showed a 50% RCA lesion and some 20% LAD and circ lesions. Here she was found to be tachycardiac, dehydrated with nausea and vomiting. She denied any chest pain, but her troponin was up over 8, and we have been called to admit her.   PAST MEDICAL HISTORY: 1. Coronary artery disease, recent history of NSTEMI MI.  2. Cardiomyopathy with an ejection fraction of 35%.  3. Hypertension.  4. Polycythemia.  5. Hyperlipidemia.  6. Tobacco abuse.  7. Agoraphobia.  8. Anxiety.   PAST SURGICAL HISTORY: None.   SOCIAL HISTORY: She continues to smoke. She does not do any alcohol or drugs.   FAMILY HISTORY: Significant for hypertension, diabetes, and stroke.   ALLERGIES: No known drug allergies.   CURRENT MEDICATIONS: Zoloft 50 mg q. day, Xanax 1 mg t.i.d. and 2 mg at bedtime p.r.n., Strattera 80 mg q. day, Simvastatin 40 mg q. day, hydrochlorothiazide/lisinopril 12.5/20 mg b.i.d., Coreg 6.25 mg b.i.d., aspirin 325 mg daily.   REVIEW OF SYSTEMS:  CONSTITUTIONAL: She has had fever.  EYES: No blurred vision.  ENT: No hearing loss.  CARDIOVASCULAR: No chest pain.  PULMONARY: No shortness of breath.  GASTROINTESTINAL: She has had nausea and vomiting.  GENITOURINARY: No dysuria.  ENDOCRINE: No heat or cold intolerance.  INTEGUMENTAL: No rash.  MUSCULOSKELETAL: Occasional joint pain.  NEUROLOGICAL: No numbness or weakness.   PHYSICAL EXAMINATION: VITAL SIGNS: Temperature is 100, pulse 127, respirations 24, blood pressure 169/110.  GENERAL: This is a well-nourished white female who appears anxious.   HEENT: The pupils are equal, round, and reactive to light. Sclerae are anicteric. Oral mucosa is dry. Oropharynx is clear.  Nasopharynx is clear.  NECK: Supple. No JVD, lymphadenopathy or thyromegaly.  CARDIOVASCULAR: Tachy with no murmurs.  LUNGS: Clear to auscultation. No dullness to percussion. She is not using accessory muscles.  ABDOMEN: Soft, nontender, nondistended. Bowel sounds are positive. No hepatosplenomegaly. No masses.  EXTREMITIES: No edema.  NEUROLOGIC: Cranial nerves II through XII are intact. She is alert and oriented x 4.  SKIN: Moist with no rash.   LABORATORY, DIAGNOSTIC AND RADIOLOGICAL DATA:  BUN is 29, creatinine is 1.65. Troponin is 8.09.  EKG shows sinus tachycardia with some T wave abnormalities in the septal and inferior leads, unchanged from prior.   ASSESSMENT AND PLAN: 1. Non ST-elevation acute myocardial infarction: The patient has once again bumped her cardiac enzymes up to 8.09. There are no EKG changes compared to old. Her cath before showed a 50% RCA lesion. Either she is spasming or has occluded this with an acute plaque, although her EKG does not reflect that. Could be her dehydration combined with her tachycardia, that she is not getting enough blood flow through the lesion.  However, usually that does not occur with  a 50% lesion. I am going to go ahead and start her on a heparin drip, continue her aspirin, beta blocker ACE inhibitor and statin.  I will go ahead and treat her underlying medical problems to get her more stable for any type of studies that Cardiology would wants to do. I am  going to go ahead and consult Cardiology.  2. Acute renal failure: This dehydration from the vomiting. I will replete this aggressively to help get her heart rate down, give her IV fluids.  3. Hypokalemia: This could also contribute to her tachycardia. I will go ahead and replete this and recheck it in the morning.  4. Vomiting and nausea: I was concerned about this being  an anginal equivalent; however, she is febrile, has high white count, so I am suspecting this may be a viral illness. However, I am going to go ahead and treat this with antiemetics and supportive care.  5. Fever: Again, I believe this is a febrile viral illness. I am going to go ahead and check a flu swab to make sure she does not have an acute case of influenza.  6. Agoraphobia:  She is very anxious. We are going to go ahead and keep her on her Xanax and give her p.r.n. additional, if needed.  7. Tobacco abuse: We did discuss smoking cessation, and she does continue to smoke. I will offer her a nicotine patch.  8. Hypertension: She does have a history of noncompliance with her medications, but we will go ahead and keep her on her current medications and adjust dosing as needed.   TIME SPENT ON ADMISSION: 60 minutes.   ____________________________ Gracelyn Nurse, MD jdj:cb D: 10/21/2012 19:33:21 ET T: 10/21/2012 20:15:49 ET JOB#: [id number]  cc: Gracelyn Nurse, MD, <Dictator> Muhammad A. Kirke Corin, MD Outside Physician, Kym Groom, MD  Gracelyn Nurse MD ELECTRONICALLY SIGNED 10/21/2012 21:59

## 2015-02-08 NOTE — Consult Note (Signed)
PATIENT NAME:  [name], [name] MR#:  [id number] DATE OF BIRTH:  10-19-1961  DATE OF CONSULTATION:  10/23/2012  CONSULTING PHYSICIAN:  Scot Junobert T. Elliott, MD  ADDENDUM:  Her troponin today is down to 0.82 from a high of 8.09 two days ago. Her drug screen is negative except for benzodiazepines. Her white blood count is down to 10, hemoglobin 14.3, platelet count 121. She is on heparin and her PTT is 80 today, down from 104 yesterday.   Her liver panel shows a bilirubin of 1.1, albumin 4.7, alkaline phosphatase 93, SGOT 46, SGPT 19.  BUN 2 days ago was 29, today is 21, creatinine 1.65 on admission and today is 1.06.  Potassium of 3.1. Chest x-ray shows no evidence of acute pulmonary disease.   ____________________________ Scot Junobert T. Elliott, MD rte:ct D: 10/23/2012 13:37:00 ET T: 10/23/2012 14:41:54 ET JOB#: [id number]  cc: Scot Junobert T. Elliott, MD, <Dictator>  Scot JunOBERT T ELLIOTT MD ELECTRONICALLY SIGNED 11/06/2012 9:42

## 2015-02-09 NOTE — Discharge Summary (Signed)
PATIENT NAME:  [name], [name] MR#:  [id number] DATE OF BIRTH:  [birth year]  DATE OF ADMISSION:  04/25/2014 DATE OF DISCHARGE:  04/26/2014  CHIEF COMPLAINT: Headache, nausea and vomiting.   DISCHARGE DIAGNOSES: 1.  Nausea and vomiting due to migraine headache.  2.  Hypokalemia.  3.  Hypertension.  4.  Depression.  5.  Anxiety.  6.  Gastroesophageal reflux disease. 7.  History of agoraphobia.  DISCHARGE MEDICATIONS: Carvedilol 12.5 mg 2 times a day, pantoprazole 40 mg 2 times a day, amlodipine 10 mg once a day, sertraline 100 mg once a day, simvastatin 20 mg once a day, lisinopril 10 mg 2 times a day, sucralfate 1 gram 4 times a day as needed, quetiapine 100 mg once a day. Stop taking hydrochlorothiazide.   DISCHARGE DIET: Low sodium.   DISCHARGE ACTIVITY: As tolerated.   DISCHARGE FOLLOWUP: Please follow with PCP and check a BMP and serum electrolytes in 1 to 2 weeks.   DISPOSITION: Home.   SIGNIFICANT LABS AND IMAGING: Initial potassium 2.8, last potassium 4.4. LFTs no significant abnormalities. Troponin negative. U-tox positive for benzodiazepines as well as cannabinoids. Initial white count 13.4; last white count 6.9. UA did not suggest an infection.   HISTORY OF PRESENT ILLNESS AND HOSPITAL COURSE: For full details of H and P, please see the dictation on July 8th by Dr. Nemiah CommanderKalisetti, but briefly this is a 54 year old female who comes in with headache, nausea and vomiting. She was likely having a migraine. She is not on chronic migraine suppressive medication. She was admitted to the hospitalist service as she had significant hypokalemia with potassium 2.8 and for her vomiting she was given antiemetics, was started on some IV fluids, and her potassium was repleted. She had significant improvement with Toradol IV for the headache and at this point the potassium through supplementation has normalized. She is tolerating diet. She has no further nausea or vomiting and she will be discharged.    CODE STATUS: The patient is FULL code.  TOTAL TIME SPENT: About 37 minutes.   ____________________________ Krystal EatonShayiq Tige Meas, MD sa:sb D: 04/27/2014 08:38:19 ET T: 04/27/2014 10:22:02 ET JOB#: [id number]  cc: Krystal EatonShayiq Mitsuo Budnick, MD, <Dictator> Meindert A. Lacie ScottsNiemeyer, MD Krystal EatonSHAYIQ Brianna Esson MD ELECTRONICALLY SIGNED 04/27/2014 17:17

## 2015-02-09 NOTE — H&P (Signed)
PATIENT NAME:  [name], [name] MR#:  [id number] DATE OF BIRTH:  Mar 09, 1961  DATE OF ADMISSION:  04/25/2014  ADMITTING PHYSICIAN: Enid Baas, M.D.   PRIMARY CARE PHYSICIAN: Dr. Lacie Scotts.   CHIEF COMPLAINT: Headache, nausea and vomiting.   HISTORY OF PRESENT ILLNESS: [name] is a 54 year old Caucasian female with past medical history significant for depression and anxiety, hypertension, gastroesophageal reflux disease, who presents to the hospital secondary to intractable nausea and vomiting that started last evening. The patient states that she does have a history of migraine headaches, but does not seem to be on any maintenance medication. She was doing fine up until last evening and she is started to have intractable occipital headache associated with nausea and vomiting. She was unable to keep her blood pressure medications down and so presented to the hospital. Blood pressure was elevated here and her potassium is low at 2.8.  She is being admitted under observation for the same. She denies any recent travel. Denies eating out. She denies any abdominal pain or diarrhea at this time. No blurry vision. No tingling, numbness or other focal neurological symptoms.   PAST MEDICAL HISTORY:  1.  Migraines.  2.  Hypertension.  3.  Agoraphobia.  4.  Depression and anxiety.  5.  Gastroesophageal reflux disease.   PAST SURGICAL HISTORY: C-section.   ALLERGIES: No known drug allergies.   CURRENT HOME MEDICATIONS:  1.  Xanax 1 mg p.o. q.i.d.  2.  Tramadol 50 mg p.o. q.6 hours p.r.n.  3.  Sertraline 100 mg p.o. daily.  4.  Sucralfate 1 gram q.i.d.  5.  Hydrochlorothiazide 25 mg daily.  6.  Protonix 40 mg p.o. b.i.d.  7.  Quetiapine 100 mg at bedtime.  8.  Tylenol 650 mg q.6 hours p.r.n. for arthritis pain.  SOCIAL HISTORY: Lives at home with her daughter and son, continues to smoke about 1 pack per day. Denies any alcohol use but does use marijuana.    FAMILY HISTORY:  Significant for  diabetes and hypertension in the family.   REVIEW OF SYSTEMS:  CONSTITUTIONAL: No fever, fatigue, or weakness.  EYES: No blurred vision, double vision, inflammation or glaucoma.  ENT: No tinnitus, ear pain, hearing loss, epistaxis or discharge.  RESPIRATORY: No cough, wheeze, hemoptysis, or COPD.  CARDIOVASCULAR: No chest pain, orthopnea, edema, arrhythmia, palpitations, or syncope.  GASTROINTESTINAL: Positive for nausea, vomiting, no diarrhea, abdominal pain, hematemesis or melena.  GENITOURINARY: No dysuria, hematuria, renal calculus, frequency or incontinence.  ENDOCRINE: No polyuria, nocturia, thyroid problems, heat or cold intolerance.  HEMATOLOGY: No anemia, easy bruising or bleeding.  SKIN: No acne, rash or lesions. MUSCULOSKELETAL: No neck fracture, pain, arthritis or gout.  NEUROLOGIC: No numbness, weakness, CVA, TIA or seizures.  PSYCHOLOGICAL: No anxiety, insomnia or depression at this time.  PHYSICAL EXAMINATION:  VITAL SIGNS: Temperature 98.5, pulse 74, respirations 18, blood pressure 164/112, pulse oximetry 100% on room air.  GENERAL: Well-built, well-nourished female sitting in bed, not in any acute distress.  HEENT: Normocephalic, atraumatic. Pupils equal, round, reacting to light. Anicteric sclerae. Extraocular movements intact. Oropharynx clear without erythema, mass or icterus.   NECK: Supple, no thyromegaly, JVD or carotid bruits or lymphadenopathy. LUNGS: Moving bilaterally. No wheeze or crackles or rales, no use of accessory muscles for breathing.  CARDIOVASCULAR: S1, S2, regular rate and rhythm. No murmur rubs, or gallops.  ABDOMEN: Soft, nontender, nondistended. No organomegaly. Normal bowel sounds.  EXTREMITIES: No pedal edema. No clubbing or cyanosis, tibial and dorsalis pedis pulses palpable bilaterally.  SKIN: No acne, rash or lesions.  LYMPHATICS: No cervical or inguinal lymphadenopathy.  NEUROLOGIC: Cranial nerves intact. No focal motor or sensory deficits.   PSYCHOLOGICAL: The patient is awake, alert, oriented x3.   LABORATORY DATA: WBC 13.4, hemoglobin 17.9, hematocrit 53.0, platelet count 211,000. Sodium 137, potassium 3.8, chloride 100, bicarbonate 27, BUN 13, creatinine 0.5, Glucose 110 and calcium 9.4.   ALT 17, AST 19, alkaline phosphatase 62, total bilirubin 0.7, and likely benign 3.8, CK 135, CK-MB 1.4, troponin less than 0.02, lipase 169.  Urine toxicology screen positive for marijuana and benzodiazepines.  Urinalysis showed high proteinuria.   EKG showing normal sinus rhythm, no acute ST-T wave abnormalities. Heart rate of 75.   ASSESSMENT AND PLAN: This is a 54 year old female with hypertension, depression, anxiety, history of cyclical nausea-vomiting syndrome who presents with intractable nausea, vomiting and hyperkalemia.  1.  Intractable nausea and vomiting associated with headache. The patient has migraine history and also history of cyclical nausea-vomiting, she also uses marijuana.  Will admit her under observation for IV fluids with Zofran and Phenergan at this time.  2.  Hypokalemia. Likely from vomiting and also being on hydrochlorothyazide. Hold hydrochlorothyazide, potassium chloride replacement and recheck.  3.  Hypertension. Start Norvasc as it is on hold now.  4.  Depression and anxiety. Continue home medications.  5.  Gastroesophageal reflux disease on sucralfate and Protonix.  6.  Tobacco history.  Counseled against smoking. Wants to be on a Nicotrol inhaler.  CODE STATUS: Full code.   Time spent on admission: 50 minutes.    ____________________________ Enid Baasadhika Jolan Mealor, MD rk:lt D: 04/25/2014 15:14:49 ET T: 04/25/2014 15:45:16 ET JOB#: [id number]  cc: Enid Baasadhika Tawna Alwin, MD, <Dictator> Meindert A. Lacie ScottsNiemeyer, MD  Enid BaasADHIKA Camran Keady MD ELECTRONICALLY SIGNED 04/26/2014 10:49

## 2015-02-10 NOTE — Consult Note (Signed)
Referring Physician:  Benjie Karvonen, Sital :   Primary Care Physician:  Mody, Sital : [facility], [employer], [place]. [address], [place], [place] [address], [place]  Reason for Consult:  Admit Date: 06-Apr-2012   Chief Complaint: acute vision loss and confusion   Reason for Consult: confusion   History of Present Illness:  History of Present Illness:   54 yo RHD F presents to [facility] after having an episode where she loss vision and became confused.  Pt continues to have some word finding difficulties that has improved since admission.  She denies headache, numbness and weakness.  ROS:   HEENT no complaints    Lungs no complaints    Cardiac no complaints    GI no complaints    GU no complaints    Musculoskeletal no complaints    Extremities no complaints    Skin no complaints    Neuro word finding difficulties    Endocrine no complaints    Psych no complaints   Past Medical/Surgical Hx:  Insomnia:   Anxiety:   Hypertension:   Past Medical/ Surgical Hx:   Past Medical History HTN, agoraphobia    Past Surgical History none   Home Medications: Medication Instructions Last Modified Date/Time  sertraline 100 mg oral tablet 1 tab(s) orally once a day 19-Jun-13 13:50  clonidine 0.3 mg oral tablet 1 tab(s) orally 2 times a day 19-Jun-13 13:50  Lopressor 50 mg oral tablet 1 tab(s) orally 2 times a day 19-Jun-13 13:50  alprazolam 0.5 mg oral tablet 1 tab(s) orally every 6 hours, As Needed- for Agitation , for Anxiety, Nervousness  19-Jun-13 13:50  hydrochlorothiazide-lisinopril 12.5 mg-20 mg oral tablet 1 tab(s) orally 2 times a day 19-Jun-13 13:50  promethazine 25 mg oral tablet 1 tab(s) orally every 6 hours, As Needed- for Nausea, Vomiting  19-Jun-13 13:50   Allergies:  No Known Allergies:   Social/Family History:  Employment Status: unemployed   Lives With: children   Living Arrangements: house   Social History: one pack per day for 30  years, rare EtOH, denies illicits;  used to work as a Merchandiser, retail   Family History: F with stroke   Vital Signs: **Vital Signs.:   21-Jun-13 08:10   Vital Signs Type Routine   Temperature Temperature (F) 98   Temperature Source oral   Pulse Pulse 47   Pulse source per vital sign device   Respirations Respirations 16   Systolic BP Systolic BP 950   Diastolic BP (mmHg) Diastolic BP (mmHg) 932   Mean BP 121   BP Source vital sign device; right arm   Pulse Ox % Pulse Ox % 94   Pulse Ox Activity Level  At rest   Oxygen Delivery Room Air/ 21 %    08:12   Vital Signs Type Recheck   Pulse Pulse 50   Pulse source per Telemetry Clerk   Systolic BP Systolic BP 671   Diastolic BP (mmHg) Diastolic BP (mmHg) 93   Mean BP 109   BP Source vital sign device; left arm   Physical Exam:  General: alert, no acute distress, overweight   HEENT: normocephalic, sclera nonicteric, oropharynx clear   Neck: supple, no JVD, no bruits   Chest: CTA B, no wheezes, good movement   Cardiac: RRR, no murmurs, no edema, 2+ pulses   Extremities: no C/C/E, FROM   Neurologic Exam:  Mental Status: alert and oriented x 3, normal speech and language, follows complex commands   Cranial  Nerves: PERRLA, EOMI, nl VF, face symmetric, tongue midline, shoulder shrug equal   Motor Exam: 5/5 B normal, tone, no tremor   Deep Tendon Reflexes: 3+/4 B, downgoing Plantars   Sensory Exam: pinprick, temperature, and vibration intact B   Coordination: FTN and HTS WNL, nl RAM, nl gait   Lab Results: Hepatic:  19-Jun-13 13:45    Bilirubin, Total 0.8   Alkaline Phosphatase 68   SGPT (ALT) 24 (12-78 NOTE: NEW REFERENCE RANGE 09/11/2011)   SGOT (AST) 26   Total Protein, Serum 6.6   Albumin, Serum 3.6  Routine Chem:  19-Jun-13 13:45    Glucose, Serum 95   BUN  30   Creatinine (comp) 1.19   Sodium, Serum 139   Potassium, Serum 3.7   Chloride, Serum 103   CO2, Serum 23   Calcium (Total), Serum 9.1   Osmolality (calc)  284   eGFR (African American) >60   eGFR (Non-African American)  54 (eGFR values <54m/min/1.73 m2 may be an indication of chronic kidney disease (CKD). Calculated eGFR is useful in patients with stable renal function. The eGFR calculation will not be reliable in acutely ill patients when serum creatinine is changing rapidly. It is not useful in  patients on dialysis. The eGFR calculation may not be applicable to patients at the low and high extremes of body sizes, pregnant women, and vegetarians.)   Anion Gap 13  Cardiac:  19-Jun-13 12:45    Troponin I < 0.02 (0.00-0.05 0.05 ng/mL or less: NEGATIVE  Repeat testing in 3-6 hrs  if clinically indicated. >0.05 ng/mL: POTENTIAL  MYOCARDIAL INJURY. Repeat  testing in 3-6 hrs if  clinically indicated. NOTE: An increase or decrease  of 30% or more on serial  testing suggests a  clinically important change)  Routine UA:  20-Jun-13 15:50    Color (UA) Yellow   Clarity (UA) Clear   Glucose (UA) Negative   Bilirubin (UA) Negative   Ketones (UA) Negative   Specific Gravity (UA) 1.015   Blood (UA) Negative   pH (UA) 6.0   Protein (UA) Negative   Nitrite (UA) Negative   Leukocyte Esterase (UA) Negative (Result(s) reported on 07 Apr 2012 at 06:08PM.)   RBC (UA) 1 /HPF   WBC (UA) 1 /HPF   Bacteria (UA) TRACE   Epithelial Cells (UA) 3 /HPF   Mucous (UA) PRESENT (Result(s) reported on 07 Apr 2012 at 0[facility])  Routine Hem:  19-Jun-13 18:33    Erythrocyte Sed Rate 1 (Result(s) reported on 06 Apr 2012 at 07:44PM.)  20-Jun-13 05:07    WBC (CBC) 7.1   RBC (CBC) 4.75   Hemoglobin (CBC) 15.6   Hematocrit (CBC) 46.2   Platelet Count (CBC)  101   MCV 97   MCH 32.8   MCHC 33.8   RDW 13.7   Neutrophil % 69.6   Lymphocyte % 22.9   Monocyte % 6.0   Eosinophil % 1.0   Basophil % 0.5   Neutrophil # 4.9   Lymphocyte # 1.6   Monocyte # 0.4   Eosinophil # 0.1   Basophil # 0.0 (Result(s) reported on 07 Apr 2012 at 0[facility])   Radiology  Results: [place]    19-Jun-13 17:44, [place] Doppler Bilateral   [place] Doppler Bilateral    REASON FOR EXAM:    tia  COMMENTS:       PROCEDURE: [place] - [place] DOPPLER BILATERAL  - Apr 06 2012  5:44PM     RESULT: Neither  the right nor left carotid systems demonstrate calcified   or soft plaque. The waveform patterns are normal and the color flow   images are normal. The cardiac rate appears somewhat low but is regular.   On the right the peak internal carotid systolic velocity measured 39 cm   per second and the peak common carotid velocity measured 75 cm per second   corresponding to a normal ratio of 0.5. On the left the peak internal   carotid systolic velocity measured 65 cm per second and the peak common   carotid velocity measured 63 cm per second corresponding to normal ratio   of one. The vertebral arteries are normal in flow direction bilaterally.    IMPRESSION:  I see no evidence of a hemodynamically significant carotid   stenosis.     Dictation Site: 5          Verified By: DAVID A. Martinique, M.D., MD  CT:    19-Jun-13 14:31, CT Head Without Contrast   CT Head Without Contrast    REASON FOR EXAM:    CVA / Confusion  COMMENTS:   May transport without cardiac monitor    PROCEDURE: CT  - CT HEAD WITHOUT CONTRAST  - Apr 06 2012  2:31PM     RESULT: Comparison:  None    Technique: Multiple axial images from the foramen magnum to the vertex   were obtained without IV contrast.    Findings:      There is no evidence of mass effect, midline shift, or extra-axial fluid   collections.  There is no evidence of a space-occupying lesion or   intracranial hemorrhage. There is no evidence of a cortical-based area of     acute infarction.  There is periventricular white matter low attenuation   likely secondary to microangiopathy.    The ventricles and sulci are appropriate for the patient's age. The basal   cisterns are patent.    Visualized portions of the orbits  are unremarkable. The visualized   portions of the paranasal sinuses and mastoid air cells are unremarkable.     The osseous structures are unremarkable.    IMPRESSION:      No acute intracranial process.    Dictation Site: 1          Verified By: Jennette Banker, M.D., MD   Impression/Recommendations:  Recommendations:   labs noted to have an elevated BUN personally reviewed by me and shows subacute diffuse diffusion weighted changes that could be small infarcts in multiple different vascular territories, there is also old mild white matter disease;  no enchancement d/w referring provider   Acute vision loss and confusion-  no clear etiology and this has resolved;  by history it sounds kind of like Posterior Reversible Encephalopathic Syndrome but MRI does not confirm.  Differential includes TIA, seizure or anxiety as well but not good enough history to tell what this was. Small infarcts on MRI-  these look subacute and could be secondary to either embolic disease vs. a vasculitis or could be seen with illicit drug usage.  These do not appear to be multiple sclerosis. Hypertension-  difficult to control especially with someone this age, consider this to be secondary Depression/Anxiety-  stable MRA of head check ESR, CRP, ANA, antiphospholipids, Factor V Leiden needs UDS if MRA and ESR/CRP neg, can d/c home;  if positive, pt will need LP continue ASA good BP control will follow up results  Electronic Signatures: Jamison Neighbor (MD)  (  Signed 21-Jun-13 10:03)  Authored: REFERRING PHYSICIAN, Primary Care Physician, Consult, History of Present Illness, Review of Systems, PAST MEDICAL/SURGICAL HISTORY, HOME MEDICATIONS, ALLERGIES, Social/Family History, NURSING VITAL SIGNS, Physical Exam-, LAB RESULTS, RADIOLOGY RESULTS, Recommendations   Last Updated: 21-Jun-13 10:03 by Jamison Neighbor (MD)

## 2015-02-10 NOTE — Discharge Summary (Signed)
PATIENT NAME:  [name], [name] MR#:  [id number] DATE OF BIRTH:  [birth year]  DATE OF ADMISSION:  04/03/2012 DATE OF DISCHARGE:  04/05/2012  DIAGNOSES:  1. Hypertensive emergency due to medication noncompliance. 2. Nausea, vomiting, headache due to above.  3. Leukocytosis, reactive.  4. Hypokalemia.  5. Hyperglycemia.  6. Polycythemia. 7. Insomnia. 8. Anxiety, depression.   DISPOSITION: Patient is being discharged home.   FOLLOW UP: Follow up with [facility].  DIET: Low sodium.   ACTIVITY: As tolerated   DISCHARGE MEDICATIONS:  1. Zoloft 100 mg daily.  2. Ambien 10 mg at bedtime.  3. Clonidine 0.3 mg b.i.d.  4. HCTZ/lisinopril 12.5/20 mg b.i.d.  5. Lopressor 50 mg b.i.d.  6. Phenergan 25 mg q.6 hours p.r.n.  7. Xanax 0.5 mg every six hours p.r.n. anxiety.   LABORATORY, DIAGNOSTIC AND RADIOLOGICAL DATA: CT of the head and abdomen showed no acute abnormality. Chest x-ray showed no acute abnormality. EKG was sinus rhythm with sinus arrhythmia. Urine pregnancy test negative. Urinalysis showed no evidence of infection. White count 16.3 on admission, went as high as 22.8, normal by the time of discharge. Hemoglobin ranging from 19.8 to 16.1. Normal renal function. Potassium 2.8 on admission, normal by the time discharge. Hemoglobin A1c 5. Cardiac enzymes 0.06 to 0.04.   HOSPITAL COURSE: Patient is a 54 year old female with past medical history of anxiety, insomnia and hypertension who recently lost her job and was unable to afford her medications, therefore, she stopped her medications a week prior to admission. She presented with nausea, vomiting, and headache, was found to have hypertensive emergency and was admitted to the Intensive Care Unit initially and had to be started on a nitroglycerin drip. Simultaneously she was started on oral medications. Her nitroglycerin drip was successfully titrated off and her blood pressure remained under good control on oral medications. A urine drug  screen was positive for benzodiazepines and opioids but she is taking benzodiazepines at home and received opioids in the hospital. Patient was advised about medication compliance. Case management assisted patient with her medications. She had some mild headache and nausea but was not vomiting anymore. She had leukocytosis with no evidence of infection and she was afebrile. The  leukocytosis resolved spontaneously. She had hypokalemia which was supplemented. She also had hyperglycemia. Her hemoglobin A1c was found to be normal. She had polycythemia. Patient had reported to the admitting doctor that she had quit smoking, however, during the hospitalization she requested to smoke cigarettes. With conservative management her polycythemia improved. She is being discharged home in a stable condition. She will follow up at [facility].  TIME SPENT: 45 minutes.   ____________________________ Darrick MeigsSangeeta Allona Gondek, MD sp:cms D: 04/05/2012 15:28:18 ET T: 04/05/2012 15:59:05 ET JOB#: [id number]  cc: Darrick MeigsSangeeta Liani Caris, MD, <Dictator> [facility]  Darrick MeigsSANGEETA Francely Craw MD ELECTRONICALLY SIGNED 04/07/2012 13:17

## 2015-02-10 NOTE — H&P (Signed)
PATIENT NAME:  [name], [name] MR#:  [id number] DATE OF BIRTH:  09-14-1961  DATE OF ADMISSION:  04/06/2012  PRIMARY CARE PHYSICIAN: [facility]    CHIEF COMPLAINT: Acute blindness.   HISTORY OF PRESENT ILLNESS: The patient is a 54 year old female with a history of hypertension, chronic anxiety and depression who was just discharged from the hospital yesterday with hypertensive urgency, was put back on her medications including clonidine, HCTZ, lisinopril, and Lopressor who presents today with acute blindness. Apparently the patient was sitting watching TV. The telephone was ringing and she did not answer the phone. Her family asked her "why aren't you answering the phone, can't you see", and apparently she said that she was not able to see. There are no other neurological deficits noted. No slurred speech or facial droop or weakness. She does not know how long she was unable to see but she does say that things were just pitch black and she felt spaced out. She did not see any floaters. Her vision recovered by the time she got to the ER. She also had a headache during her last admission. She still has a headache but it's not as bad.   REVIEW OF SYSTEMS: CONSTITUTIONAL: No fever, fatigue, weakness. EYES: Blindness. No glaucoma or cataracts. ENT: No ear pain, hearing loss, or seasonal allergies. RESPIRATORY: No cough, wheezing, hemoptysis, or COPD. CARDIOVASCULAR: No chest pain, palpitations, syncope, orthopnea, edema, arrhythmia, or dyspnea on exertion. GI: No nausea, vomiting, diarrhea, abdominal pain, melena, or ulcers. GU: No dysuria or hematuria. ENDOCRINE: No polyuria or polydipsia. HEME/LYMPH: No anemia or easy bruising. SKIN: No rash or lesions. MUSCULOSKELETAL: No limited activity. NEUROLOGIC: No history of CVA, TIA, or seizures. PSYCH: Positive anxiety and depression.   PAST MEDICAL HISTORY:  1. Hypertension.  2. Anxiety/depression.  3. Insomnia.   MEDICATIONS:  1. Xanax 0.5 mg q.6  hours p.r.n.  2. Clonidine 0.3 mg b.i.d.  3. HCTZ/lisinopril 12.5/20 b.i.d.  4. Lopressor 50 mg b.i.d.  5. Promethazine 25 mg q.6 hours p.r.n. nausea.  6. Zoloft 100 mg daily.   ALLERGIES: No known drug allergies.   FAMILY HISTORY: Positive for diabetes and hypertension.   SOCIAL HISTORY: No tobacco, alcohol, or IV drug use.   PAST SURGICAL HISTORY: None.   PHYSICAL EXAMINATION:   VITAL SIGNS: Temperature 98, pulse 56, respirations 18, blood pressure 135/72, 98% on room air.   GENERAL: The patient is alert and oriented not in acute distress.    HEENT: Head is atraumatic. Pupils are round, reactive. Sclerae are anicteric. Mucous membranes are moist. Oropharynx is clear.   NECK: Supple without JVD, carotid bruit, or enlarged thyroid.   CARDIOVASCULAR: Regular rate and rhythm. No murmurs, gallops, or rubs. PMI is not displaced.   LUNGS: Clear to auscultation bilaterally without crackles, rales, rhonchi, or wheezing. Normal to percussion.   ABDOMEN: Obese. Bowel sounds are positive. Nontender. Hard to appreciate organomegaly due to body habitus.  EXTREMITIES: No clubbing, cyanosis, or edema.   NEUROLOGIC: Cranial nerves II through XII are intact. There are no focal deficits. Heel-to-shin is intact. Cerebellar exam is normal.   SKIN: Without rash or lesions.   LABORATORY, DIAGNOSTIC AND RADIOLOGIC DATA: White blood cells 12.4, hemoglobin 16.2, hematocrit 49.7, platelets 145. Troponin less than 0.02.   CT of the head without contrast showed no evidence of acute intracranial hemorrhage or CVA.   Chest x-ray shows no cardiopulmonary disease.   EKG normal sinus rhythm, left atrial enlargement, nonspecific T wave abnormalities in 1 and aVL.  ASSESSMENT AND PLAN: This is a 54 year old female presenting with what she says is blindness.  1. Vision changes. The patient says that she was blind for an unknown amount of time. No other neurological deficits. This does not sound like a  TIA or CVA without any other kind of neurological deficits. She had an echocardiogram just two days ago which essentially was normal with showed a normal ejection fraction, no wall motion abnormalities. I'll go ahead and order some carotid Doppler's. I will also check an ESR to rule out temporal arteritis as the patient did have a headache during this admission as well as last admission. If it's elevated, will need a stat Ophthalmology consult and steroids will be started. If it is negative and the carotid Doppler's are negative, she will need an outpatient Ophthalmology evaluation.  2. Hypertension. Will continue her outpatient medications. Apparently she had some problems picking up all of her prescriptions but this has been cleared up and she will be able to pick up all of her medications. 3. Anxiety/depression. Continue her outpatient medications.  4. Leukocytosis and elevated hemoglobin and hematocrit likely secondary to some mild dehydration. Will provide some IV fluids.   CODE STATUS: The patient is FULL CODE status.    TIME SPENT: Approximately 50 minutes.   ____________________________ Donell Beers. Benjie Karvonen, MD spm:drc D: 04/06/2012 16:32:34 ET T: 04/06/2012 16:50:50 ET JOB#: [id number] cc: Cristopher Ciccarelli P. Benjie Karvonen, MD, <Dictator>, [facility]  Aime Carreras P Sharnee Douglass MD ELECTRONICALLY SIGNED 04/06/2012 18:29

## 2015-02-10 NOTE — Discharge Summary (Signed)
PATIENT NAME:  [name], [name] MR#:  [id number] DATE OF BIRTH:  24-Oct-1960  DATE OF ADMISSION:  04/06/2012 DATE OF DISCHARGE:  04/08/2012  PRIMARY CARE PHYSICIAN: [facility]   CONSULTANTS: Mellody DrownMatthew Smith, MD - Neurology  FINAL DIAGNOSES:  1. Malignant hypertension.  2. Disorientation and blurry vision with headache.  3. Subacute cerebrovascular accident.   REASON FOR ADMISSION: The patient was admitted on 04/06/2012 with acute blindness.  HISTORY OF PRESENT ILLNESS: This is a 54 year old female with hypertension, anxiety, and depression just discharged from the hospital with hypertensive urgency. She was watching television and became disoriented, could not out of both eyes, was not answering questions appropriately, and was brought to the emergency room for further evaluation. She was admitted as an observation.   LABORATORY, DIAGNOSTIC AND RADIOLOGIC DATA: Troponin was negative. Glucose 95, BUN 30, creatinine 1.19, sodium 139, potassium 3.7, chloride 103, CO2 23, calcium 9.1, and total bilirubin 0.8. Liver function tests normal. White blood cell count 12.4, hemoglobin and hematocrit 16.2 and 49.7, and platelet count 145.   CT scan of the head showed no acute intracranial process.   Chest x-ray showed stable cardiomegaly. Lung fields are clear. Atelectasis left mid lung field.  EKG showed sinus bradycardia, left atrial enlargement, left axis deviation, 49 beats per minute.   Ultrasound of the carotids: No stenosis.  Sedimentation rate 1. Repeat CBC showed a white blood cell count of 7.1, hemoglobin and hematocrit 15.6 and 46.2, and platelet count 101.  MRI of the brain with and without contrast showed tiny multiple bilateral punctate high signal intensities on diffusion weighted imaging images, possibility of embolic disease, cannot rule out MS. No large acute or subacute stroke noted.  Urinalysis negative.   B12 252. Antiphospholipid panel and anticardiolipin antibodies were  negative. ANA negative. CRP 2.6.  MRA of the brain without contrast showed no intracranial aneurysm or stenosis.   Pending laboratory data included antithrombin-3 and factor V Leiden and the rest of the antiphospholipid syndrome panel. A lipid panel was ordered by me, but was canceled by the performing department for unknown reason. I was not given a call back on why.  HOSPITAL COURSE PER PROBLEM LIST:  1. For the patient's malignant hypertension, blood pressure was up and down during the hospital course. I was trying to adjust medications because of her bradycardia. I stopped her Toprol and lowered her dose of the clonidine and I kept her on her usual hydrochlorothiazide/lisinopril. Blood pressure upon discharge by me personally taking it, 135/85. Blood pressure very variable during the hospital course. On presentation it was probably lower than her usual, down at 115/58 with heart rate of 45, and upon discharge her blood pressure did go up when I tried to stop the clonidine, but I think she does need a small dose of this, and I stopped the Toprol secondary to the bradycardia. Heart rate upon discharge is 58 and blood pressure again 132/85.  2. For disorientation and blurry vision with headache, probably secondary to her blood pressure being very variable, very high on the previous hospitalization, given medications and now lower than her usual may have caused her symptoms. The patient still did have a headache which I think will get better over time. Blurry vision had resolved and the disorientation had resolved.  3. For her subacute cerebrovascular accident, Dr. Mellody DrownMatthew Smith, from neurology, saw the patient to review the MRI of the brain. He was okay with aspirin only. If hypercoagulable profile comes back abnormal can consider Coumadin.  He thinks these were subacute and not happening on this hospitalization or even the previous hospitalization. He does not believe that this is MS. Follow-up at the [facility] recommended. If needed can be referred to neurology. 4. For the patient's depression and anxiety, she is on Zoloft and alprazolam.  DISCHARGE MEDICATIONS:  1. Zoloft 100 mg daily.  2. Alprazolam 0.5 mg every six hours as needed for anxiety.  3. Hydrochlorothiazide/lisinopril 12.5/20 mg 1 tablet twice a day. 4. Clonidine, does had been decreased to 0.1 mg twice a day. 5. Aspirin 325 mg daily - new medication. 6. Percocet 5/325 mg 1 tablet every six hours as needed for pain and headache. Small prescription was given for this. 7. Simvastatin 10 mg at bedtime.   NOTE: I advised her to stop the Lopressor and promethazine.  DISCHARGE INSTRUCTIONS: Upon followup at the [facility], recommend checking liver function tests and a lipid profile in one month and following up the rest of the hypercoagulable work-up can consider replacement of B12.  TIME SPENT ON DISCHARGE: 35 minutes. ____________________________ Herschell Dimes. Renae Gloss, MD rjw:slb D: 04/11/2012 12:25:43 ET T: 04/11/2012 12:46:21 ET JOB#: [id number]  cc: Herschell Dimes. Renae Gloss, MD, <Dictator> [facility] Salley Scarlet MD ELECTRONICALLY SIGNED 04/12/2012 16:31

## 2015-02-10 NOTE — H&P (Signed)
PATIENT NAME:  [name], [name] MR#:  [id number] DATE OF BIRTH:  22-May-1961  DATE OF ADMISSION:  04/03/2012  REFERRING PHYSICIAN: Dr. Shaune Pollack in the Emergency Room   FAMILY PHYSICIAN: None local.   REASON FOR ADMISSION: Hypertensive urgency with malignant hypertension associated with nausea, vomiting, and headache.   HISTORY OF PRESENT ILLNESS: The patient is a 54 year old female with a history of hypertension, anxiety, and chronic insomnia. The patient ran out of her medication approximately 1 to 2 weeks ago. She presents to the Emergency Room with worsening headache associated with nausea and vomiting. In the Emergency Room, the patient was noted to have malignant hypertension with hypertensive emergency. She was given IV labetalol as well as IV lorazepam in the Emergency Room with only minimal improvement of her blood pressure. She was subsequently started on nitroglycerin drip and is now admitted for further evaluation.   PAST MEDICAL HISTORY:  1. Benign hypertension.  2. Chronic anxiety.  3. Chronic insomnia.   MEDICATIONS:  1. Ambien 10 mg p.o. at bedtime.  2. Zoloft 100 mg p.o. daily.  3. Toprol-XL 50 mg p.o. daily.  4. Hyzaar 100/25 1 p.o. daily.  5. Xanax 0.5 mg p.o. t.i.d.   ALLERGIES: No known drug allergies.   SOCIAL HISTORY: The patient quit smoking several years ago. Denies alcohol abuse.   FAMILY HISTORY: Positive for hypertension and diabetes.   REVIEW OF SYSTEMS: CONSTITUTIONAL: No fever or change in weight. EYES: No blurred or double vision. No glaucoma. ENT: No tinnitus or hearing loss. No nasal discharge or bleeding. No difficulty swallowing. RESPIRATORY: Denies cough or wheezing. No painful respiration. CARDIOVASCULAR: No chest pain or orthopnea. No palpitations or syncope. GI: No diarrhea. No abdominal pain. No change in bowel habits. GU: No dysuria or hematuria. No incontinence. ENDOCRINE: No polyuria or polydipsia. No heat or cold intolerance. HEMATOLOGIC: The patient  denies anemia, easy bruising, or bleeding. LYMPHATIC: No swollen glands. MUSCULOSKELETAL: The patient denies pain in her neck, back, shoulders, knees, or hips. No gout. NEUROLOGIC: No numbness although she does have generalized weakness. Denies migraines, stroke, or seizures. PSYCH: The patient denies anxiety or depression although she does admit to insomnia.   PHYSICAL EXAMINATION:   GENERAL: The patient is acutely ill appearing in moderate distress.   VITAL SIGNS: Vital signs are currently remarkable for a blood pressure of 210/121 with a heart rate of 77 and a respiratory rate of 20. She is afebrile.   HEENT: Normocephalic, atraumatic. Pupils equally round and reactive to light and accommodation. Extraocular movements are intact. Sclerae are nonicteric. Conjunctivae are clear. Oropharynx is clear.   NECK: Supple without JVD or bruits. No adenopathy or thyromegaly is noted.   LUNGS: Clear to auscultation and percussion without wheezes, rales, or rhonchi. No dullness.   CARDIAC: Regular rate and rhythm with normal S1, S2. There is a 2/6 systolic murmur noted throughout the precordium. No rubs or gallops present.   ABDOMEN: Soft, nontender with normoactive bowel sounds. No organomegaly or masses were appreciated. No hernias or bruits were noted.   EXTREMITIES: Without clubbing, cyanosis, or edema. Pulses were 2+ bilaterally.   SKIN: Warm and dry without rash or lesions.   NEUROLOGIC: Cranial nerves II through XII grossly intact. Deep tendon reflexes were symmetric. Motor and sensory exams nonfocal.   PSYCH: The patient was alert and oriented to person, place, and time. She was cooperative and used good judgment.   LABORATORY DATA: White count was 16.4 with a hemoglobin of 19.8. Glucose was 169 with  a BUN of 21 and a creatinine of 0.89 with a sodium of 141 and a potassium of 2.8. GFR was greater than 60.   CT of the head was unremarkable.   CT of the abdomen and pelvis was essentially  unremarkable as well.   EKG revealed sinus rhythm with no acute ischemic changes.   ASSESSMENT:  1. Hypertensive emergency with malignant hypertension.  2. Hypokalemia.  3. Profound headache.  4. Nausea with vomiting.  5. Hyperglycemia.  6. Polycythemia.   PLAN:  1. The patient will be admitted to the Intensive Care Unit with nitroglycerin drip.  2. She will be started on IV fluids for hydration with potassium supplementation.  3. Will resume her outpatient blood pressure regimen and wean the nitroglycerin drip off as tolerated.  4. Will follow serial cardiac enzymes.  5. Will obtain an echocardiogram.  6. Will check on her chest x-ray today as she is having some shortness of breath.  7. Supplement oxygen empirically at this time and wean as tolerated.  8. Neuro checks q.4 hours with vital signs q.4 hours.  9. Further treatment and evaluation will depend upon the patient's progress.   TOTAL TIME SPENT ON THIS PATIENT: 50 minutes.  ____________________________ Duane LopeJeffrey D. Judithann SheenSparks, MD jds:drc D: 04/03/2012 15:36:13 ET T: 04/03/2012 15:59:39 ET JOB#: [id number] Lasharn Bufkin D Jerone Cudmore MD ELECTRONICALLY SIGNED 04/03/2012 17:04

## 2015-02-28 ENCOUNTER — Other Ambulatory Visit: Payer: Self-pay | Admitting: *Deleted

## 2015-02-28 DIAGNOSIS — Z1231 Encounter for screening mammogram for malignant neoplasm of breast: Secondary | ICD-10-CM

## 2015-03-06 ENCOUNTER — Other Ambulatory Visit: Payer: Self-pay | Admitting: Cardiovascular Disease

## 2015-03-08 ENCOUNTER — Encounter: Payer: Self-pay | Admitting: *Deleted

## 2015-03-08 ENCOUNTER — Ambulatory Visit: Payer: Medicaid Other | Admitting: Cardiovascular Disease

## 2015-03-20 ENCOUNTER — Telehealth: Payer: Self-pay | Admitting: *Deleted

## 2015-03-20 ENCOUNTER — Other Ambulatory Visit: Payer: Self-pay

## 2015-03-20 ENCOUNTER — Other Ambulatory Visit: Payer: Self-pay | Admitting: *Deleted

## 2015-03-20 MED ORDER — LISINOPRIL 10 MG PO TABS: 10.0000 mg | ORAL_TABLET | Freq: Two times a day (BID) | ORAL | Status: DC

## 2015-03-20 MED ORDER — HYDROCHLOROTHIAZIDE 25 MG PO TABS: 25.0000 mg | ORAL_TABLET | Freq: Every day | ORAL | Status: DC

## 2015-03-20 NOTE — Telephone Encounter (Signed)
°  1. Which medications need to be refilled? Lisinopril,  diuretic  2. Which pharmacy is medication to be sent to? [facility] in [place]   3. Do they need a 30 day or 90 day supply? Just enough until apt in July   4. Would they like a call back once the medication has been sent to the pharmacy? No

## 2015-03-20 NOTE — Telephone Encounter (Signed)
Refill sent for lisinopril  

## 2015-03-21 ENCOUNTER — Ambulatory Visit: Payer: Medicaid Other | Attending: Family Medicine

## 2015-04-29 ENCOUNTER — Telehealth: Payer: Self-pay | Admitting: *Deleted

## 2015-04-29 ENCOUNTER — Ambulatory Visit: Payer: Medicaid Other | Admitting: Cardiovascular Disease

## 2015-04-29 MED ORDER — CARVEDILOL 12.5 MG PO TABS: ORAL_TABLET | ORAL | Status: DC

## 2015-04-29 MED ORDER — AMLODIPINE BESYLATE 10 MG PO TABS: 10.0000 mg | ORAL_TABLET | Freq: Every day | ORAL | Status: DC

## 2015-04-29 MED ORDER — SIMVASTATIN 20 MG PO TABS: 20.0000 mg | ORAL_TABLET | Freq: Every day | ORAL | Status: DC

## 2015-04-29 MED ORDER — HYDROCHLOROTHIAZIDE 25 MG PO TABS: 25.0000 mg | ORAL_TABLET | Freq: Every day | ORAL | Status: DC

## 2015-04-29 NOTE — Telephone Encounter (Signed)
  1. Which medications need to be refilled? Simvastin, Carvedolil, Hydrochlorothazid, amlodipine  2. Which pharmacy is medication to be sent to[place] [facility] [place]  3. Do they need a 30 day or 90 day supply? 90 day supply  4. Would they like a call back once the medication has been sent to the pharmacy? Yes

## 2015-04-29 NOTE — Telephone Encounter (Signed)
Refill sent for simvastatin, carvedilol, hctz and amlodipine.

## 2015-05-15 ENCOUNTER — Inpatient Hospital Stay: Payer: Medicaid Other

## 2015-05-15 ENCOUNTER — Inpatient Hospital Stay
Admission: EM | Admit: 2015-05-15 | Discharge: 2015-05-16 | DRG: 641 | Disposition: A | Discharge status: Home / Self Care | Payer: Medicaid Other | Attending: Internal Medicine | Admitting: Internal Medicine

## 2015-05-15 DIAGNOSIS — E785 Hyperlipidemia, unspecified: Secondary | ICD-10-CM | POA: Diagnosis present

## 2015-05-15 DIAGNOSIS — I252 Old myocardial infarction: Secondary | ICD-10-CM

## 2015-05-15 DIAGNOSIS — Z8673 Personal history of transient ischemic attack (TIA), and cerebral infarction without residual deficits: Secondary | ICD-10-CM | POA: Diagnosis not present

## 2015-05-15 DIAGNOSIS — G94 Other disorders of brain in diseases classified elsewhere: Secondary | ICD-10-CM | POA: Diagnosis present

## 2015-05-15 DIAGNOSIS — K219 Gastro-esophageal reflux disease without esophagitis: Secondary | ICD-10-CM | POA: Diagnosis present

## 2015-05-15 DIAGNOSIS — F17210 Nicotine dependence, cigarettes, uncomplicated: Secondary | ICD-10-CM | POA: Diagnosis present

## 2015-05-15 DIAGNOSIS — I5022 Chronic systolic (congestive) heart failure: Secondary | ICD-10-CM | POA: Diagnosis present

## 2015-05-15 DIAGNOSIS — Z79899 Other long term (current) drug therapy: Secondary | ICD-10-CM | POA: Diagnosis not present

## 2015-05-15 DIAGNOSIS — G4700 Insomnia, unspecified: Secondary | ICD-10-CM | POA: Diagnosis present

## 2015-05-15 DIAGNOSIS — R0602 Shortness of breath: Secondary | ICD-10-CM

## 2015-05-15 DIAGNOSIS — I2510 Atherosclerotic heart disease of native coronary artery without angina pectoris: Secondary | ICD-10-CM | POA: Diagnosis present

## 2015-05-15 DIAGNOSIS — T502X5A Adverse effect of carbonic-anhydrase inhibitors, benzothiadiazides and other diuretics, initial encounter: Secondary | ICD-10-CM | POA: Diagnosis present

## 2015-05-15 DIAGNOSIS — Z7982 Long term (current) use of aspirin: Secondary | ICD-10-CM | POA: Diagnosis not present

## 2015-05-15 DIAGNOSIS — Z716 Tobacco abuse counseling: Secondary | ICD-10-CM | POA: Diagnosis present

## 2015-05-15 DIAGNOSIS — I10 Essential (primary) hypertension: Secondary | ICD-10-CM | POA: Diagnosis present

## 2015-05-15 DIAGNOSIS — Z91410 Personal history of adult physical and sexual abuse: Secondary | ICD-10-CM | POA: Diagnosis not present

## 2015-05-15 DIAGNOSIS — E871 Hypo-osmolality and hyponatremia: Principal | ICD-10-CM | POA: Diagnosis present

## 2015-05-15 DIAGNOSIS — F410 Panic disorder [episodic paroxysmal anxiety] without agoraphobia: Secondary | ICD-10-CM | POA: Diagnosis present

## 2015-05-15 DIAGNOSIS — E669 Obesity, unspecified: Secondary | ICD-10-CM | POA: Diagnosis present

## 2015-05-15 LAB — URINALYSIS COMPLETE WITH MICROSCOPIC (ARMC ONLY)
Bilirubin Urine: NEGATIVE
Glucose, UA: NEGATIVE mg/dL
HGB URINE DIPSTICK: NEGATIVE
KETONES UR: NEGATIVE mg/dL
Leukocytes, UA: NEGATIVE
Nitrite: NEGATIVE
PH: 6 (ref 5.0–8.0)
Protein, ur: NEGATIVE mg/dL
SPECIFIC GRAVITY, URINE: 1.006 (ref 1.005–1.030)

## 2015-05-15 LAB — BASIC METABOLIC PANEL
ANION GAP: 10 (ref 5–15)
BUN: 18 mg/dL (ref 6–20)
CALCIUM: 8.4 mg/dL — AB (ref 8.9–10.3)
CHLORIDE: 83 mmol/L — AB (ref 101–111)
CO2: 25 mmol/L (ref 22–32)
Creatinine, Ser: 0.91 mg/dL (ref 0.44–1.00)
GFR calc Af Amer: >60 mL/min (ref >60)
GLUCOSE: 109 mg/dL — AB (ref 65–99)
Potassium: 3 mmol/L — ABNORMAL LOW (ref 3.5–5.1)
Sodium: 118 mmol/L — CL (ref 135–145)

## 2015-05-15 LAB — SODIUM
Sodium: 127 mmol/L — ABNORMAL LOW (ref 135–145)
Sodium: 128 mmol/L — ABNORMAL LOW (ref 135–145)
Sodium: 131 mmol/L — ABNORMAL LOW (ref 135–145)

## 2015-05-15 LAB — TSH: TSH: 0.608 u[IU]/mL (ref 0.350–4.500)

## 2015-05-15 LAB — TROPONIN I: Troponin I: <0.03 ng/mL (ref <0.031)

## 2015-05-15 LAB — CBC WITH DIFFERENTIAL/PLATELET
Basophils Absolute: 0 10*3/uL (ref 0–0.1)
Basophils Relative: 1 %
Eosinophils Absolute: 0.1 10*3/uL (ref 0–0.7)
Eosinophils Relative: 1 %
HCT: 46 % (ref 35.0–47.0)
Hemoglobin: 16 g/dL (ref 12.0–16.0)
LYMPHS ABS: 2.4 10*3/uL (ref 1.0–3.6)
LYMPHS PCT: 30 %
MCH: 33.6 pg (ref 26.0–34.0)
MCHC: 34.7 g/dL (ref 32.0–36.0)
MCV: 96.8 fL (ref 80.0–100.0)
Monocytes Absolute: 0.5 10*3/uL (ref 0.2–0.9)
Monocytes Relative: 6 %
NEUTROS ABS: 4.9 10*3/uL (ref 1.4–6.5)
NEUTROS PCT: 62 %
Platelets: 158 10*3/uL (ref 150–440)
RBC: 4.76 MIL/uL (ref 3.80–5.20)
RDW: 14.1 % (ref 11.5–14.5)
WBC: 7.9 10*3/uL (ref 3.6–11.0)

## 2015-05-15 LAB — CORTISOL: CORTISOL PLASMA: 3.7 ug/dL

## 2015-05-15 LAB — SODIUM, URINE, RANDOM: SODIUM UR: 47 mmol/L

## 2015-05-15 MED ORDER — POTASSIUM CHLORIDE IN NACL 20-0.9 MEQ/L-% IV SOLN
INTRAVENOUS | Status: DC
  Administered 2015-05-15 – 2015-05-16 (×2): via INTRAVENOUS
  Filled 2015-05-15 (×5): qty 1000

## 2015-05-15 MED ORDER — ALPRAZOLAM 1 MG PO TABS
2.0000 mg | ORAL_TABLET | Freq: Every evening | ORAL | Status: DC | PRN
  Administered 2015-05-15: 2 mg via ORAL
  Filled 2015-05-15: qty 2

## 2015-05-15 MED ORDER — PROMETHAZINE HCL 25 MG/ML IJ SOLN
25.0000 mg | Freq: Once | INTRAMUSCULAR | Status: AC
  Administered 2015-05-15: 25 mg via INTRAVENOUS
  Filled 2015-05-15: qty 1

## 2015-05-15 MED ORDER — SIMVASTATIN 20 MG PO TABS
20.0000 mg | ORAL_TABLET | Freq: Every day | ORAL | Status: DC
  Administered 2015-05-15: 20 mg via ORAL
  Filled 2015-05-15: qty 1

## 2015-05-15 MED ORDER — ENOXAPARIN SODIUM 40 MG/0.4ML ~~LOC~~ SOLN
40.0000 mg | SUBCUTANEOUS | Status: DC
  Administered 2015-05-15: 40 mg via SUBCUTANEOUS
  Filled 2015-05-15: qty 0.4

## 2015-05-15 MED ORDER — SERTRALINE HCL 50 MG PO TABS
50.0000 mg | ORAL_TABLET | Freq: Every day | ORAL | Status: DC
  Filled 2015-05-15: qty 1

## 2015-05-15 MED ORDER — SENNOSIDES-DOCUSATE SODIUM 8.6-50 MG PO TABS: 1.0000 | ORAL_TABLET | Freq: Every evening | ORAL | Status: DC | PRN

## 2015-05-15 MED ORDER — ASPIRIN EC 81 MG PO TBEC
81.0000 mg | DELAYED_RELEASE_TABLET | Freq: Every day | ORAL | Status: DC
Start: 2015-05-15 — End: 2015-05-16

## 2015-05-15 MED ORDER — ACETAMINOPHEN 325 MG PO TABS
650.0000 mg | ORAL_TABLET | Freq: Four times a day (QID) | ORAL | Status: DC | PRN
Start: 2015-05-15 — End: 2015-05-16

## 2015-05-15 MED ORDER — ONDANSETRON HCL 4 MG/2ML IJ SOLN
4.0000 mg | Freq: Once | INTRAMUSCULAR | Status: AC
  Administered 2015-05-15: 4 mg via INTRAVENOUS
  Filled 2015-05-15: qty 2

## 2015-05-15 MED ORDER — ONDANSETRON HCL 4 MG PO TABS: 4.0000 mg | ORAL_TABLET | Freq: Four times a day (QID) | ORAL | Status: DC | PRN

## 2015-05-15 MED ORDER — IBUPROFEN 400 MG PO TABS: 400.0000 mg | ORAL_TABLET | Freq: Four times a day (QID) | ORAL | Status: DC | PRN

## 2015-05-15 MED ORDER — BISACODYL 5 MG PO TBEC: 10.0000 mg | DELAYED_RELEASE_TABLET | Freq: Every day | ORAL | Status: DC | PRN

## 2015-05-15 MED ORDER — SODIUM CHLORIDE 0.9 % IV BOLUS (SEPSIS)
1000.0000 mL | Freq: Once | INTRAVENOUS | Status: AC
  Administered 2015-05-15: 1000 mL via INTRAVENOUS

## 2015-05-15 MED ORDER — ATOMOXETINE HCL 80 MG PO CAPS
80.0000 mg | ORAL_CAPSULE | Freq: Every day | ORAL | Status: DC
  Filled 2015-05-15 (×2): qty 1

## 2015-05-15 MED ORDER — AMLODIPINE BESYLATE 10 MG PO TABS: 10.0000 mg | ORAL_TABLET | Freq: Every day | ORAL | Status: DC

## 2015-05-15 MED ORDER — ALBUTEROL SULFATE (2.5 MG/3ML) 0.083% IN NEBU: 2.5000 mg | INHALATION_SOLUTION | RESPIRATORY_TRACT | Status: DC | PRN

## 2015-05-15 MED ORDER — LORAZEPAM 2 MG/ML IJ SOLN
0.5000 mg | Freq: Once | INTRAMUSCULAR | Status: AC
  Administered 2015-05-15: 0.5 mg via INTRAVENOUS
  Filled 2015-05-15: qty 1

## 2015-05-15 MED ORDER — ACETAMINOPHEN 650 MG RE SUPP: 650.0000 mg | Freq: Four times a day (QID) | RECTAL | Status: DC | PRN

## 2015-05-15 MED ORDER — ONDANSETRON HCL 4 MG/2ML IJ SOLN: 4.0000 mg | Freq: Four times a day (QID) | INTRAMUSCULAR | Status: DC | PRN

## 2015-05-15 MED ORDER — LISINOPRIL 10 MG PO TABS
10.0000 mg | ORAL_TABLET | Freq: Two times a day (BID) | ORAL | Status: DC
  Administered 2015-05-15: 10 mg via ORAL
  Filled 2015-05-15: qty 1

## 2015-05-15 MED ORDER — SODIUM CHLORIDE 0.9 % IJ SOLN: 3.0000 mL | Freq: Two times a day (BID) | INTRAMUSCULAR | Status: DC

## 2015-05-15 MED ORDER — METOCLOPRAMIDE HCL 5 MG PO TABS
5.0000 mg | ORAL_TABLET | Freq: Three times a day (TID) | ORAL | Status: DC
  Administered 2015-05-15: 5 mg via ORAL
  Filled 2015-05-15 (×2): qty 1

## 2015-05-15 MED ORDER — PANTOPRAZOLE SODIUM 40 MG PO TBEC
40.0000 mg | DELAYED_RELEASE_TABLET | Freq: Two times a day (BID) | ORAL | Status: DC
  Administered 2015-05-15: 40 mg via ORAL
  Filled 2015-05-15: qty 1
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
## 2015-05-15 NOTE — H&P (Signed)
[facility] [place] [facility]   PATIENT NAME: [name]    MR#:  [id number]  DATE OF BIRTH:  06-21-1961  DATE OF ADMISSION:  05/15/2015  PRIMARY CARE PHYSICIAN: [name], MD   REQUESTING/REFERRING PHYSICIAN: [name]  CHIEF COMPLAINT:   Chief Complaint  Patient presents with  . Anxiety  . Nausea  . Emesis    HISTORY OF PRESENT ILLNESS:  [name]  is a 54 y.o. female with a known history of hypertension, CHF, anxiety, CVA presents to the emergency room complaining of a panic attack. Her panic attack seems to have resolved at this point. But on routine labs her sodium has been found to be low at 118. No history of hyponatremia. Per daughter she had some confusion earlier today morning which has resolved. Presently she is alert and oriented 3. No seizures or weakness. No diarrhea. Had one episode of vomiting today with her panic attack. Good oral intake. Normal urination. No recent change in medications. No thyroid problems. She is on hydrochlorothiazide for her hypertension which is unchanged for 2 years. No polydipsia.  PAST MEDICAL HISTORY:   Past Medical History  Diagnosis Date  . Dyslipidemia   . History of hyperkalemia   . Insomnia   . Malignant hypertension   . Polycythemia     chronic; due to smoking  . Anxiety and depression   . CVA (cerebrovascular accident) 03/2012  . Coronary artery disease nonobstructive    a. 09/2012 NSTEMI/Cath: LM nl, LAD 45m, 20d, LCX  20ost, /m, RCA 50p, 6m, EF 35%;  b. 10/2012 NSTEMI in setting of anxiety, n/v, htn emergency, EF 55% by echo-->felt to be stress induced.  . Stress-induced cardiomyopathy     a. 09/2012 EF 35%;  b. 10/2012 Echo: EF >55%, mod conc LVH, nl RV.  Marland Kitchen Hyperlipidemia   . Agoraphobia   . Hypokalemia     a. 10/2012 hospitalization  . Thrombocytopenia     a. 10/2012 hospitalization  . Obesity     a. previously over 400 lbs.  . Tobacco abuse     a. previously smoked 4  ppd.    PAST SURGICAL HISTORY:   Past Surgical History  Procedure Laterality Date  . Cardiac catheterization  09/2012    [facility]    SOCIAL HISTORY:   History  Substance Use Topics  . Smoking status: Current Every Day Smoker -- 1.00 packs/day for 30 years    Types: Cigarettes  . Smokeless tobacco: Not on file  . Alcohol Use: No    FAMILY HISTORY:   Family History  Problem Relation Age of Onset  . Family history unknown: Yes    DRUG ALLERGIES:  No Known Allergies  REVIEW OF SYSTEMS:   Review of Systems  Constitutional: Negative for fever, chills and weight loss.  HENT: Negative for hearing loss and nosebleeds.   Eyes: Negative for blurred vision, double vision and pain.  Respiratory: Negative for cough, hemoptysis, sputum production, shortness of breath and wheezing.   Cardiovascular: Negative for chest pain, palpitations, orthopnea and leg swelling.  Gastrointestinal: Negative for nausea, vomiting, abdominal pain, diarrhea and constipation.  Genitourinary: Negative for dysuria and hematuria.  Musculoskeletal: Negative for myalgias, back pain and falls.  Skin: Negative for rash.  Neurological: Positive for dizziness, weakness and headaches. Negative for tremors, sensory change, speech change, focal weakness and seizures.  Endo/Heme/Allergies: Does not bruise/bleed easily.  Psychiatric/Behavioral: Positive for memory loss (confusion earlier today per daughter). Negative for depression.  The patient is nervous/anxious.     MEDICATIONS AT HOME:   Prior to Admission medications   Medication Sig Start Date End Date Taking? Authorizing Provider  alprazolam Prudy Feeler) 2 MG tablet Take 2 mg by mouth at bedtime as needed.   Yes Historical Provider, MD  amLODipine (NORVASC) 10 MG tablet Take 1 tablet (10 mg total) by mouth daily. 04/29/15  Yes Iran Ouch, MD  aspirin 81 MG tablet Takes 2 tablets daily.   Yes Historical Provider, MD  atomoxetine (STRATTERA) 80 MG capsule Take  80 mg by mouth daily.   Yes Historical Provider, MD  bisacodyl (DULCOLAX) 5 MG EC tablet Take 5 mg by mouth daily as needed for constipation.   Yes Historical Provider, MD  carvedilol (COREG) 12.5 MG tablet TAKE 1 TABLET BY MOUTH TWICE DAILY WITH A MEAL 04/29/15  Yes Iran Ouch, MD  hydrochlorothiazide (HYDRODIURIL) 25 MG tablet Take 1 tablet (25 mg total) by mouth daily. 04/29/15  Yes Iran Ouch, MD  lisinopril (PRINIVIL,ZESTRIL) 10 MG tablet Take 1 tablet (10 mg total) by mouth 2 (two) times daily. 03/20/15  Yes Iran Ouch, MD  metoCLOPramide (REGLAN) 5 MG tablet Take 5 mg by mouth 3 (three) times daily.   Yes Historical Provider, MD  pantoprazole (PROTONIX) 40 MG tablet Take 1 tablet (40 mg total) by mouth 2 (two) times daily. 12/09/12  Yes Antonieta Iba, MD  sertraline (ZOLOFT) 50 MG tablet Take 50 mg by mouth daily.   Yes Historical Provider, MD  simvastatin (ZOCOR) 20 MG tablet Take 1 tablet (20 mg total) by mouth at bedtime. 04/29/15  Yes Iran Ouch, MD      VITAL SIGNS:  Blood pressure 102/71, pulse 58, resp. rate 14, SpO2 97 %.  PHYSICAL EXAMINATION:  Physical Exam  GENERAL:  54 y.o.-year-old patient lying in the bed with no acute distress.  EYES: Pupils equal, round, reactive to light and accommodation. No scleral icterus. Extraocular muscles intact.  HEENT: Head atraumatic, normocephalic. Oropharynx and nasopharynx clear. No oropharyngeal erythema, moist oral mucosa  NECK:  Supple, no jugular venous distention. No thyroid enlargement, no tenderness.  LUNGS: Normal breath sounds bilaterally, no wheezing, rales, rhonchi. No use of accessory muscles of respiration.  CARDIOVASCULAR: S1, S2 normal. No murmurs, rubs, or gallops.  ABDOMEN: Soft, nontender, nondistended. Bowel sounds present. No organomegaly or mass.  EXTREMITIES: No pedal edema, cyanosis, or clubbing. + 2 pedal & radial pulses b/l.   NEUROLOGIC: Cranial nerves II through XII are intact. No focal  Motor or sensory deficits appreciated b/l PSYCHIATRIC: The patient is alert and oriented x 3. Good affect.  SKIN: No obvious rash, lesion, or ulcer.   LABORATORY PANEL:   CBC  Recent Labs Lab 05/15/15 1023  WBC 7.9  HGB 16.0  HCT 46.0  PLT 158   ------------------------------------------------------------------------------------------------------------------  Chemistries   Recent Labs Lab 05/15/15 1023  NA 118*  K 3.0*  CL 83*  CO2 25  GLUCOSE 109*  BUN 18  CREATININE 0.91  CALCIUM 8.4*   ------------------------------------------------------------------------------------------------------------------  Cardiac Enzymes  Recent Labs Lab 05/15/15 1023  TROPONINI <0.03   ------------------------------------------------------------------------------------------------------------------  RADIOLOGY:  No results found.   IMPRESSION AND PLAN:   * Severe Hyponatremia, Euvolemic - chronic and asymptomatic Likely from HCTZ.  Needs slow correction of 8-10 mEq in the first 24 hours. Start normal saline 100 ML per hour. Consult nephrology. Every 4 hours sodium check. Input and output and daily weight. Stop hydrochlorothiazide. Check urine sodium,  TSH, cortisol levels. Check chest x-ray for any mass with her history of smoking.  * HTN Continue medications except hydrochlorothiazide. Presently in the normal range.  * Anxiety Continue home medications. Her panic attack is resolved.  * Chronic systolic CHF No signs of fluid overload.  * Tobacco abuse Counseled to quit smoking for greater than 3 minutes.  * DVT prophylaxis with Lovenox.  All the records are reviewed and case discussed with ED provider. Management plans discussed with the patient, family and they are in agreement.  CODE STATUS: FULL  TOTAL TIME TAKING CARE OF THIS PATIENT: 40 minutes.    Milagros Loll R M.D on 05/15/2015 at 3:33 PM  Between 7am to 6pm - Pager - [phone]  After 6pm go to  [email or web address] - password [email or web address] [facility]  [facility]  Office  [phone]  CC: Primary care physician; [name], MD

## 2015-05-15 NOTE — ED Notes (Signed)
Pt resting in bed with eyes closed.  NAD noted at this time.

## 2015-05-15 NOTE — ED Notes (Signed)
Pt c/o having issues with increased anxiety and started having N/V since 4am this morning but can not explain what caused the increased anxiety

## 2015-05-15 NOTE — ED Notes (Signed)
Assisted pt to toilet. Pt insists that we are "letting her suffer" and that she usually gets treatment faster. Pt is very anxious and her daughter states that her mother was slurring words and having difficulty forming words and sentences this morning. Pt assured that we are working as quickly as we can.

## 2015-05-15 NOTE — ED Notes (Signed)
MD notified sodium level 118.

## 2015-05-16 LAB — BASIC METABOLIC PANEL
Anion gap: 9 (ref 5–15)
BUN: 17 mg/dL (ref 6–20)
CHLORIDE: 101 mmol/L (ref 101–111)
CO2: 26 mmol/L (ref 22–32)
Calcium: 8.8 mg/dL — ABNORMAL LOW (ref 8.9–10.3)
Creatinine, Ser: 0.65 mg/dL (ref 0.44–1.00)
GFR calc non Af Amer: >60 mL/min (ref >60)
GLUCOSE: 100 mg/dL — AB (ref 65–99)
Potassium: 3.1 mmol/L — ABNORMAL LOW (ref 3.5–5.1)
SODIUM: 136 mmol/L (ref 135–145)

## 2015-05-16 LAB — SODIUM
Sodium: 134 mmol/L — ABNORMAL LOW (ref 135–145)
Sodium: 136 mmol/L (ref 135–145)

## 2015-05-16 LAB — MAGNESIUM: Magnesium: 1.9 mg/dL (ref 1.7–2.4)

## 2015-05-16 MED ORDER — POTASSIUM CHLORIDE ER 10 MEQ PO TBCR: 10.0000 meq | EXTENDED_RELEASE_TABLET | Freq: Every day | ORAL | Status: DC

## 2015-05-16 MED ORDER — MAGNESIUM OXIDE 400 (241.3 MG) MG PO TABS: 400.0000 mg | ORAL_TABLET | Freq: Every day | ORAL | Status: DC

## 2015-05-16 MED ORDER — ALPRAZOLAM 1 MG PO TABS
1.0000 mg | ORAL_TABLET | Freq: Three times a day (TID) | ORAL | Status: DC | PRN
  Administered 2015-05-16: 1 mg via ORAL
  Filled 2015-05-16: qty 1

## 2015-05-16 MED ORDER — POTASSIUM CHLORIDE CRYS ER 20 MEQ PO TBCR
40.0000 meq | EXTENDED_RELEASE_TABLET | Freq: Once | ORAL | Status: AC
  Administered 2015-05-16: 40 meq via ORAL
  Filled 2015-05-16: qty 2

## 2015-05-16 NOTE — Discharge Summary (Signed)
[facility] [place] [facility]   PATIENT NAME: [name]    MR#:  [id number]  DATE OF BIRTH:  Jun 20, 1961  DATE OF ADMISSION:  05/15/2015 ADMITTING PHYSICIAN: Milagros Loll, MD  DATE OF DISCHARGE: 05/16/2015  PRIMARY CARE PHYSICIAN: [name], MD    ADMISSION DIAGNOSIS:  Hyponatremia [E87.1] SOB (shortness of breath) [R06.02]  DISCHARGE DIAGNOSIS:  Active Problems:   Hyponatremia   SECONDARY DIAGNOSIS:   Past Medical History  Diagnosis Date  . Dyslipidemia   . History of hyperkalemia   . Insomnia   . Malignant hypertension   . Polycythemia     chronic; due to smoking  . Anxiety and depression   . CVA (cerebrovascular accident) 03/2012  . Coronary artery disease nonobstructive    a. 09/2012 NSTEMI/Cath: LM nl, LAD 7m, 20d, LCX  20ost, /m, RCA 50p, 33m, EF 35%;  b. 10/2012 NSTEMI in setting of anxiety, n/v, htn emergency, EF 55% by echo-->felt to be stress induced.  . Stress-induced cardiomyopathy     a. 09/2012 EF 35%;  b. 10/2012 Echo: EF >55%, mod conc LVH, nl RV.  Marland Kitchen Hyperlipidemia   . Agoraphobia   . Hypokalemia     a. 10/2012 hospitalization  . Thrombocytopenia     a. 10/2012 hospitalization  . Obesity     a. previously over 400 lbs.  . Tobacco abuse     a. previously smoked 4 ppd.    HOSPITAL COURSE:   1. Acute encephalopathy and hyponatremia- patient was admitted to the hospital with acute encephalopathy and found to have a low sodium. This was believed secondary to hydrochlorothiazide and this was stopped. Her sodium normalized with IV fluid hydration. I recommend her stop the hydrochlorothiazide and stay off of this medication lifelong. 2. Hypokalemia- likely also secondary to hydrochlorothiazide this was replaced orally and IV fluids. I will continue to supplement for 7 days upon discharge and also supplement magnesium which is borderline low. 3. A panic attack and anxiety continue Xanax 4. Essential hypertension-  continue other medications minus hydrochlorothiazide 5. History of coronary artery disease and cardiomyopathy- no signs of congestive heart failure on this hospital stay. 6 hyperlipidemia unspecified- on Zocor. 7. Gastroesophageal reflux disease without esophagitis on Protonix.  DISCHARGE CONDITIONS:   Satisfactory  CONSULTS OBTAINED:  Nephrology  DRUG ALLERGIES:  No Known Allergies  DISCHARGE MEDICATIONS:   Current Discharge Medication List    START taking these medications   Details  magnesium oxide (MAG-OX) 400 (241.3 MG) MG tablet Take 1 tablet (400 mg total) by mouth daily. Qty: 7 tablet, Refills: 0    potassium chloride (K-DUR) 10 MEQ tablet Take 1 tablet (10 mEq total) by mouth daily. Qty: 7 tablet, Refills: 0      CONTINUE these medications which have NOT CHANGED   Details  alprazolam (XANAX) 2 MG tablet Take 2 mg by mouth at bedtime as needed.    amLODipine (NORVASC) 10 MG tablet Take 1 tablet (10 mg total) by mouth daily. Qty: 90 tablet, Refills: 3    aspirin 81 MG tablet Takes 2 tablets daily.    atomoxetine (STRATTERA) 80 MG capsule Take 80 mg by mouth daily.    bisacodyl (DULCOLAX) 5 MG EC tablet Take 5 mg by mouth daily as needed for constipation.    carvedilol (COREG) 12.5 MG tablet TAKE 1 TABLET BY MOUTH TWICE DAILY WITH A MEAL Qty: 180 tablet, Refills: 3    lisinopril (PRINIVIL,ZESTRIL) 10 MG tablet Take 1 tablet (10 mg  total) by mouth 2 (two) times daily. Qty: 60 tablet, Refills: 6    metoCLOPramide (REGLAN) 5 MG tablet Take 5 mg by mouth 3 (three) times daily.    pantoprazole (PROTONIX) 40 MG tablet Take 1 tablet (40 mg total) by mouth 2 (two) times daily. Qty: 60 tablet, Refills: 6    sertraline (ZOLOFT) 50 MG tablet Take 50 mg by mouth daily.    simvastatin (ZOCOR) 20 MG tablet Take 1 tablet (20 mg total) by mouth at bedtime. Qty: 90 tablet, Refills: 3      STOP taking these medications     hydrochlorothiazide (HYDRODIURIL) 25 MG tablet           DISCHARGE INSTRUCTIONS:   Follow-up with Dr. Lacie Scotts one week to recheck electrolytes.  If you experience worsening of your admission symptoms, develop shortness of breath, life threatening emergency, suicidal or homicidal thoughts you must seek medical attention immediately by calling 911 or calling your MD immediately  if symptoms less severe.  You Must read complete instructions/literature along with all the possible adverse reactions/side effects for all the Medicines you take and that have been prescribed to you. Take any new Medicines after you have completely understood and accept all the possible adverse reactions/side effects.   Please note  You were cared for by a hospitalist during your hospital stay. If you have any questions about your discharge medications or the care you received while you were in the hospital after you are discharged, you can call the unit and asked to speak with the hospitalist on call if the hospitalist that took care of you is not available. Once you are discharged, your primary care physician will handle any further medical issues. Please note that NO REFILLS for any discharge medications will be authorized once you are discharged, as it is imperative that you return to your primary care physician (or establish a relationship with a primary care physician if you do not have one) for your aftercare needs so that they can reassess your need for medications and monitor your lab values.    Today   CHIEF COMPLAINT:   Chief Complaint  Patient presents with  . Anxiety  . Nausea  . Emesis    HISTORY OF PRESENT ILLNESS:  [name]  is a 54 y.o. female presented with altered mental status and found to have a low sodium. Was admitted to the hospital for further evaluation and IV fluid hydration.   VITAL SIGNS:  Blood pressure 107/76, pulse 74, temperature 98.1 F (36.7 C), temperature source Oral, resp. rate 16, height 5\' 6"  (1.676 m), weight  112.6 kg (248 lb 3.8 oz), SpO2 94 %.    PHYSICAL EXAMINATION:  GENERAL:  54 y.o.-year-old patient lying in the bed with no acute distress.  EYES: Pupils equal, round, reactive to light and accommodation. No scleral icterus. Extraocular muscles intact.  HEENT: Head atraumatic, normocephalic. Oropharynx and nasopharynx clear.  NECK:  Supple, no jugular venous distention. No thyroid enlargement, no tenderness.  LUNGS: Normal breath sounds bilaterally, no wheezing, rales,rhonchi or crepitation. No use of accessory muscles of respiration.  CARDIOVASCULAR: S1, S2 normal. No murmurs, rubs, or gallops.  ABDOMEN: Soft, non-tender, non-distended. Bowel sounds present. No organomegaly or mass.  EXTREMITIES: No pedal edema, cyanosis, or clubbing.  NEUROLOGIC: Cranial nerves II through XII are intact. Muscle strength 5/5 in all extremities. Sensation intact. Gait not checked.  PSYCHIATRIC: The patient is alert and oriented x 3.  SKIN: No obvious rash, lesion, or ulcer.  DATA REVIEW:   CBC  Recent Labs Lab 05/15/15 1023  WBC 7.9  HGB 16.0  HCT 46.0  PLT 158    Chemistries   Recent Labs Lab 05/16/15 0417 05/16/15 0616  NA 136 136  K 3.1*  --   CL 101  --   CO2 26  --   GLUCOSE 100*  --   BUN 17  --   CREATININE 0.65  --   CALCIUM 8.8*  --   MG  --  1.9    Cardiac Enzymes  Recent Labs Lab 05/15/15 1023  TROPONINI <0.03     RADIOLOGY:  Dg Chest 2 View  05/15/2015   CLINICAL DATA:  Anxiety.  Nausea and vomiting.  EXAM: CHEST  2 VIEW  COMPARISON:  09/02/2013  FINDINGS: Heart size appears normal. No pleural or pericardial effusion. Bronchitic changes are noted bilaterally. Scar versus platelike atelectasis within the right midlung.  IMPRESSION: 1. Bronchitic changes. 2. Scar versus atelectasis in the right midlung.   Electronically Signed   By: Signa Kell M.D.   On: 05/15/2015 17:24    Management plans discussed with the patient, and she is in agreement.  CODE STATUS:      Code Status Orders        Start     Ordered   05/15/15 1529  Full code   Continuous     05/15/15 1529      TOTAL TIME TAKING CARE OF THIS PATIENT: 35 minutes.    Alford Highland M.D on 05/16/2015 at 9:45 AM  Between 7am to 6pm - Pager - [phone]  After 6pm go to [email or web address] - password [email or web address] [email or web address]  [facility]  Office  [phone]  CC: Primary care physician; [name], MD

## 2015-05-16 NOTE — Progress Notes (Signed)
Pt's IV was taken out. Catheter intact, no problems noted. Pt's Tele was also removed and put back at the desk. I gave pt her follow-up appointment and went over her discharge instructions. Pt had no questions. Due to financial stress, pt requested to take her daily medications at home once she arrived there. Pt was advised to wait for the volunteers to wheel her downstairs, but pt and daughter walked out of the facility before volunteers arrived.

## 2015-05-16 NOTE — Consult Note (Signed)
[facility]  CONSULT NOTE    Date: 05/16/2015                  Patient Name:  [name]  MRN: [id number]  DOB: 1961/05/20  Age / Sex: 54 y.o., female         PCP: [name], MD                 Service Requesting Consult: Dr. Elpidio Anis                 Reason for Consult: Hyponatremia            History of Present Illness: [name] is a 54 y.o. white female with hypertension, hyperlipidemia, tobacco use, CVA, anxiety with agoraphobia, depression, who was admitted to [facility] on 05/15/2015 for hyponatremia.  Patient states she was having a panic attack and confusion and brought to the ED. She states that she has been eating well and denies polydipsia. Denies any nausea or vomiting.  On presentation, her sodium was found to be 118. She was started on IV fluids and had her hydrochlorothiazide discontinued. Patient states she has been following a low salt diet due to her congestive heart failure and hypertension.  Baseline sodium form 2015 were within normal range at 143.  She is very anxious this morning and is requesting alprazolam which she states she takes four times a day.   Medications: Outpatient medications: Prescriptions prior to admission  Medication Sig Dispense Refill Last Dose  . alprazolam (XANAX) 2 MG tablet Take 2 mg by mouth at bedtime as needed.   05/14/2015 at Unknown time  . amLODipine (NORVASC) 10 MG tablet Take 1 tablet (10 mg total) by mouth daily. 90 tablet 3 05/14/2015 at Unknown time  . aspirin 81 MG tablet Takes 2 tablets daily.   05/14/2015 at Unknown time  . atomoxetine (STRATTERA) 80 MG capsule Take 80 mg by mouth daily.   05/14/2015 at Unknown time  . bisacodyl (DULCOLAX) 5 MG EC tablet Take 5 mg by mouth daily as needed for constipation.   05/14/2015 at Unknown time  . carvedilol (COREG) 12.5 MG tablet TAKE 1 TABLET BY MOUTH TWICE DAILY WITH A MEAL 180 tablet 3 05/14/2015 at Unknown time  . hydrochlorothiazide (HYDRODIURIL) 25  MG tablet Take 1 tablet (25 mg total) by mouth daily. 90 tablet 3 05/14/2015 at Unknown time  . lisinopril (PRINIVIL,ZESTRIL) 10 MG tablet Take 1 tablet (10 mg total) by mouth 2 (two) times daily. 60 tablet 6 05/14/2015 at Unknown time  . metoCLOPramide (REGLAN) 5 MG tablet Take 5 mg by mouth 3 (three) times daily.   05/14/2015 at Unknown time  . pantoprazole (PROTONIX) 40 MG tablet Take 1 tablet (40 mg total) by mouth 2 (two) times daily. 60 tablet 6 05/14/2015 at Unknown time  . sertraline (ZOLOFT) 50 MG tablet Take 50 mg by mouth daily.   05/14/2015 at Unknown time  . simvastatin (ZOCOR) 20 MG tablet Take 1 tablet (20 mg total) by mouth at bedtime. 90 tablet 3 05/14/2015 at Unknown time    Current medications: Current Facility-Administered Medications  Medication Dose Route Frequency Provider Last Rate Last Dose  . 0.9 % NaCl with KCl 20 mEq/ L  infusion   Intravenous Continuous Milagros Loll, MD 100 mL/hr at 05/16/15 0459    . acetaminophen (TYLENOL) tablet 650 mg  650 mg Oral Q6H PRN Milagros Loll, MD       Or  . acetaminophen (  TYLENOL) suppository 650 mg  650 mg Rectal Q6H PRN Srikar Sudini, MD      . albuterol (PROVENTIL) (2.5 MG/3ML) 0.083% nebulizer solution 2.5 mg  2.5 mg Nebulization Q2H PRN Srikar Sudini, MD      . ALPRAZolam Prudy Feeler) tablet 1 mg  1 mg Oral TID PRN Lamont Dowdy, MD      . amLODipine (NORVASC) tablet 10 mg  10 mg Oral Daily Milagros Loll, MD   10 mg at 05/15/15 1829  . aspirin EC tablet 81 mg  81 mg Oral Daily Milagros Loll, MD   81 mg at 05/15/15 1830  . atomoxetine (STRATTERA) capsule 80 mg  80 mg Oral Daily Milagros Loll, MD   80 mg at 05/15/15 1830  . bisacodyl (DULCOLAX) EC tablet 10 mg  10 mg Oral Daily PRN Srikar Sudini, MD      . enoxaparin (LOVENOX) injection 40 mg  40 mg Subcutaneous Q24H Milagros Loll, MD   40 mg at 05/15/15 1842  . ibuprofen (ADVIL,MOTRIN) tablet 400 mg  400 mg Oral Q6H PRN Srikar Sudini, MD      . lisinopril (PRINIVIL,ZESTRIL) tablet 10 mg   10 mg Oral BID Milagros Loll, MD   10 mg at 05/15/15 2040  . metoCLOPramide (REGLAN) tablet 5 mg  5 mg Oral TID Milagros Loll, MD   5 mg at 05/15/15 2040  . ondansetron (ZOFRAN) tablet 4 mg  4 mg Oral Q6H PRN Milagros Loll, MD       Or  . ondansetron (ZOFRAN) injection 4 mg  4 mg Intravenous Q6H PRN Srikar Sudini, MD      . pantoprazole (PROTONIX) EC tablet 40 mg  40 mg Oral BID Milagros Loll, MD   40 mg at 05/15/15 2040  . senna-docusate (Senokot-S) tablet 1 tablet  1 tablet Oral QHS PRN Srikar Sudini, MD      . sertraline (ZOLOFT) tablet 50 mg  50 mg Oral Daily Milagros Loll, MD   50 mg at 05/15/15 1843  . simvastatin (ZOCOR) tablet 20 mg  20 mg Oral QHS Milagros Loll, MD   20 mg at 05/15/15 2040  . sodium chloride 0.9 % injection 3 mL  3 mL Intravenous Q12H Milagros Loll, MD   3 mL at 05/15/15 1846      Allergies: No Known Allergies    Past Medical History: Past Medical History  Diagnosis Date  . Dyslipidemia   . History of hyperkalemia   . Insomnia   . Malignant hypertension   . Polycythemia     chronic; due to smoking  . Anxiety and depression   . CVA (cerebrovascular accident) 03/2012  . Coronary artery disease nonobstructive    a. 09/2012 NSTEMI/Cath: LM nl, LAD 20m, 20d, LCX  20ost, /m, RCA 50p, 24m, EF 35%;  b. 10/2012 NSTEMI in setting of anxiety, n/v, htn emergency, EF 55% by echo-->felt to be stress induced.  . Stress-induced cardiomyopathy     a. 09/2012 EF 35%;  b. 10/2012 Echo: EF >55%, mod conc LVH, nl RV.  Marland Kitchen Hyperlipidemia   . Agoraphobia   . Hypokalemia     a. 10/2012 hospitalization  . Thrombocytopenia     a. 10/2012 hospitalization  . Obesity     a. previously over 400 lbs.  . Tobacco abuse     a. previously smoked 4 ppd.     Past Surgical History: Past Surgical History  Procedure Laterality Date  . Cardiac catheterization  09/2012    [facility]  Family History: Family History  Problem Relation Age of Onset  . Family history unknown: Yes      Social History: History   Social History  . Marital Status: Widowed    Spouse Name: N/A  . Number of Children: N/A  . Years of Education: N/A   Occupational History  . Not on file.   Social History Main Topics  . Smoking status: Current Every Day Smoker -- 1.00 packs/day for 30 years    Types: Cigarettes  . Smokeless tobacco: Not on file  . Alcohol Use: No  . Drug Use: No  . Sexual Activity: Not on file   Other Topics Concern  . Not on file   Social History Narrative     Review of Systems: Review of Systems  Constitutional: Negative.  Negative for fever, chills, weight loss, malaise/fatigue and diaphoresis.  HENT: Negative.  Negative for congestion, ear discharge, ear pain, hearing loss, nosebleeds, sore throat and tinnitus.   Eyes: Negative.  Negative for blurred vision, double vision, photophobia, pain, discharge and redness.  Respiratory: Negative.  Negative for cough, hemoptysis, sputum production, shortness of breath, wheezing and stridor.   Cardiovascular: Negative.  Negative for chest pain, palpitations, orthopnea, claudication, leg swelling and PND.  Gastrointestinal: Negative.  Negative for heartburn, nausea, vomiting, abdominal pain, diarrhea, constipation, blood in stool and melena.  Genitourinary: Negative.  Negative for dysuria, urgency, frequency, hematuria and flank pain.  Musculoskeletal: Negative.  Negative for myalgias, back pain, joint pain, falls and neck pain.  Skin: Negative.  Negative for itching and rash.  Neurological: Positive for dizziness. Negative for tingling, tremors, sensory change, speech change, focal weakness, seizures, loss of consciousness, weakness and headaches.  Endo/Heme/Allergies: Negative.  Negative for environmental allergies and polydipsia. Does not bruise/bleed easily.  Psychiatric/Behavioral: Positive for depression and memory loss. Negative for suicidal ideas, hallucinations and substance abuse. The patient is  nervous/anxious and has insomnia.     Vital Signs: Blood pressure 107/76, pulse 74, temperature 98.1 F (36.7 C), temperature source Oral, resp. rate 16, height 5\' 6"  (1.676 m), weight 112.6 kg (248 lb 3.8 oz), SpO2 94 %.  Weight trends: Filed Weights   05/15/15 1800 05/16/15 0500  Weight: 111.766 kg (246 lb 6.4 oz) 112.6 kg (248 lb 3.8 oz)    Physical Exam: General: NAD   Head: Normocephalic, atraumatic. Moist oral mucosal membranes  Eyes: Anicteric, PERRL  Neck: Supple, trachea midline  Lungs:  Clear to auscultation  Heart: Regular rate and rhythm  Abdomen:  Soft, nontender,   Extremities:  no peripheral edema.  Neurologic: Nonfocal, moving all four extremities  Skin: No lesions  Psych anxious     Lab results: Basic Metabolic Panel:  Recent Labs Lab 05/15/15 1023  05/16/15 0137 05/16/15 0417 05/16/15 0616  NA 118*  < > 134* 136 136  K 3.0*  --   --  3.1*  --   CL 83*  --   --  101  --   CO2 25  --   --  26  --   GLUCOSE 109*  --   --  100*  --   BUN 18  --   --  17  --   CREATININE 0.91  --   --  0.65  --   CALCIUM 8.4*  --   --  8.8*  --   MG  --   --   --   --  1.9  < > = values in this interval not displayed.  Liver Function Tests: No results for input(s): AST, ALT, ALKPHOS, BILITOT, PROT, ALBUMIN in the last 168 hours. No results for input(s): LIPASE, AMYLASE in the last 168 hours. No results for input(s): AMMONIA in the last 168 hours.  CBC:  Recent Labs Lab 05/15/15 1023  WBC 7.9  NEUTROABS 4.9  HGB 16.0  HCT 46.0  MCV 96.8  PLT 158    Cardiac Enzymes:  Recent Labs Lab 05/15/15 1023  TROPONINI <0.03    BNP: Invalid input(s): POCBNP  CBG: No results for input(s): GLUCAP in the last 168 hours.  Microbiology: Results for orders placed or performed in visit on 10/21/12  Influenza A&B Antigens [facility])     Status: None   Collection Time: 10/21/12  7:30 PM  Result Value Ref Range Status   Micro Text Report   Final       COMMENT                    NEGATIVE FOR INFLUENZA A (ANTIGEN ABSENT)   COMMENT                   NEGATIVE FOR INFLUENZA B (ANTIGEN ABSENT)   ANTIBIOTIC                                                        Coagulation Studies: No results for input(s): LABPROT, INR in the last 72 hours.  Urinalysis:  Recent Labs  05/15/15 1624  COLORURINE STRAW*  LABSPEC 1.006  PHURINE 6.0  GLUCOSEU NEGATIVE  HGBUR NEGATIVE  BILIRUBINUR NEGATIVE  KETONESUR NEGATIVE  PROTEINUR NEGATIVE  NITRITE NEGATIVE  LEUKOCYTESUR NEGATIVE      Imaging: Dg Chest 2 View  05/15/2015   CLINICAL DATA:  Anxiety.  Nausea and vomiting.  EXAM: CHEST  2 VIEW  COMPARISON:  09/02/2013  FINDINGS: Heart size appears normal. No pleural or pericardial effusion. Bronchitic changes are noted bilaterally. Scar versus platelike atelectasis within the right midlung.  IMPRESSION: 1. Bronchitic changes. 2. Scar versus atelectasis in the right midlung.   Electronically Signed   By: Signa Kell M.D.   On: 05/15/2015 17:24      Assessment & Plan: Ms. Amma Crear is a 54 y.o. white female with hypertension, hyperlipidemia, tobacco use, CVA, anxiety with agoraphobia, depression, who was admitted to [facility] on 05/15/2015 for hyponatremia.   1. Hyponatremia: seems to be multifactorial with hydrochlorothiazide, low salt diet and some free water loss from heat. Sodium now at goal. Doubt this is SIADH from sertraline.  - agree with discontinuation of hydrochlorothiazide.  - if tolerating PO diet, may discontinue IVFluids.   2. Hypokalemia: from diuretic, hydrochlorothiazide - agree with IV fluids replacement - continue to monitor.   3. Anxiety: will restart alprazolam tid 1 mg.  - no longer in a panic attack.   4. Congestive Heart Failure: not in acute exacerbation.   5. Hypertension: hypotensive currently.  - amlodipine and lisinopril.  - also take carvedilol at home, this may help as an anxiolytic.    LOS: 1 Tessy Pawelski,  Beya Tipps 7/28/20168:41 AM

## 2015-05-16 NOTE — Progress Notes (Signed)
Patient ID: [name], female   DOB: Nov 14, 1960, 54 y.o.   MRN: [id number]  Work note.  Patient was in the hospital 05/15/2015 to 05/16/2015. Please excuse from work for the next 2 days through 05/18/2015. Patient may return to work 05/20/2015 without restrictions.  Dr. Alford Highland.

## 2015-05-16 NOTE — Discharge Instructions (Signed)
Hyponatremia  °Hyponatremia is when the amount of salt (sodium) in your blood is too low. When sodium levels are low, your cells will absorb extra water and swell. The swelling happens throughout the body, but it mostly affects the brain. Severe brain swelling (cerebral edema), seizures, or coma can happen.  °CAUSES  °· Heart, kidney, or liver problems. °· Thyroid problems. °· Adrenal gland problems. °· Severe vomiting and diarrhea. °· Certain medicines or illegal drugs. °· Dehydration. °· Drinking too much water. °· Low-sodium diet. °SYMPTOMS  °· Nausea and vomiting. °· Confusion. °· Lethargy. °· Agitation. °· Headache. °· Twitching or shaking (seizures). °· Unconsciousness. °· Appetite loss. °· Muscle weakness and cramping. °DIAGNOSIS  °Hyponatremia is identified by a simple blood test. Your caregiver will perform a history and physical exam to try to find the cause and type of hyponatremia. Other tests may be needed to measure the amount of sodium in your blood and urine. °TREATMENT  °Treatment will depend on the cause.  °· Fluids may be given through the vein (IV). °· Medicines may be used to correct the sodium imbalance. If medicines are causing the problem, they will need to be adjusted. °· Water or fluid intake may be restricted to restore proper balance. °The speed of correcting the sodium problem is very important. If the problem is corrected too fast, nerve damage (sometimes unchangeable) can happen. °HOME CARE INSTRUCTIONS  °· Only take medicines as directed by your caregiver. Many medicines can make hyponatremia worse. Discuss all your medicines with your caregiver. °· Carefully follow any recommended diet, including any fluid restrictions. °· You may be asked to repeat lab tests. Follow these directions. °· Avoid alcohol and recreational drugs. °SEEK MEDICAL CARE IF:  °· You develop worsening nausea, fatigue, headache, confusion, or weakness. °· Your original hyponatremia symptoms return. °· You have  problems following the recommended diet. °SEEK IMMEDIATE MEDICAL CARE IF:  °· You have a seizure. °· You faint. °· You have ongoing diarrhea or vomiting. °MAKE SURE YOU:  °· Understand these instructions. °· Will watch your condition. °· Will get help right away if you are not doing well or get worse. °Document Released: 09/25/2002 Document Revised: 12/28/2011 Document Reviewed: 03/22/2011 °[employer]® Patient Information ©2015 [employer]. This information is not intended to replace advice given to you by your health care provider. Make sure you discuss any questions you have with your health care provider. ° °

## 2015-05-23 ENCOUNTER — Ambulatory Visit: Payer: Medicaid Other

## 2015-06-13 ENCOUNTER — Encounter: Payer: Self-pay | Admitting: *Deleted

## 2015-06-13 ENCOUNTER — Ambulatory Visit: Payer: Medicaid Other | Admitting: Cardiovascular Disease

## 2015-11-26 ENCOUNTER — Emergency Department: Payer: Medicaid Other

## 2015-11-26 ENCOUNTER — Observation Stay
Admission: EM | Admit: 2015-11-26 | Discharge: 2015-11-27 | Disposition: A | Discharge status: Home / Self Care | Payer: Medicaid Other | Attending: Internal Medicine | Admitting: Internal Medicine

## 2015-11-26 ENCOUNTER — Encounter: Payer: Self-pay | Admitting: *Deleted

## 2015-11-26 DIAGNOSIS — F17210 Nicotine dependence, cigarettes, uncomplicated: Secondary | ICD-10-CM | POA: Diagnosis not present

## 2015-11-26 DIAGNOSIS — R1110 Vomiting, unspecified: Secondary | ICD-10-CM | POA: Diagnosis present

## 2015-11-26 DIAGNOSIS — I160 Hypertensive urgency: Principal | ICD-10-CM | POA: Diagnosis present

## 2015-11-26 DIAGNOSIS — A084 Viral intestinal infection, unspecified: Secondary | ICD-10-CM | POA: Diagnosis not present

## 2015-11-26 DIAGNOSIS — E785 Hyperlipidemia, unspecified: Secondary | ICD-10-CM | POA: Diagnosis not present

## 2015-11-26 DIAGNOSIS — Z6835 Body mass index (BMI) 35.0-35.9, adult: Secondary | ICD-10-CM | POA: Insufficient documentation

## 2015-11-26 DIAGNOSIS — Z8673 Personal history of transient ischemic attack (TIA), and cerebral infarction without residual deficits: Secondary | ICD-10-CM | POA: Insufficient documentation

## 2015-11-26 DIAGNOSIS — R748 Abnormal levels of other serum enzymes: Secondary | ICD-10-CM | POA: Insufficient documentation

## 2015-11-26 DIAGNOSIS — E876 Hypokalemia: Secondary | ICD-10-CM | POA: Insufficient documentation

## 2015-11-26 DIAGNOSIS — I10 Essential (primary) hypertension: Secondary | ICD-10-CM | POA: Diagnosis present

## 2015-11-26 DIAGNOSIS — D696 Thrombocytopenia, unspecified: Secondary | ICD-10-CM | POA: Insufficient documentation

## 2015-11-26 DIAGNOSIS — F329 Major depressive disorder, single episode, unspecified: Secondary | ICD-10-CM | POA: Diagnosis not present

## 2015-11-26 DIAGNOSIS — R519 Headache, unspecified: Secondary | ICD-10-CM

## 2015-11-26 DIAGNOSIS — I252 Old myocardial infarction: Secondary | ICD-10-CM | POA: Diagnosis not present

## 2015-11-26 DIAGNOSIS — Z7982 Long term (current) use of aspirin: Secondary | ICD-10-CM | POA: Diagnosis not present

## 2015-11-26 DIAGNOSIS — I2510 Atherosclerotic heart disease of native coronary artery without angina pectoris: Secondary | ICD-10-CM | POA: Insufficient documentation

## 2015-11-26 DIAGNOSIS — E669 Obesity, unspecified: Secondary | ICD-10-CM | POA: Insufficient documentation

## 2015-11-26 DIAGNOSIS — K219 Gastro-esophageal reflux disease without esophagitis: Secondary | ICD-10-CM | POA: Diagnosis not present

## 2015-11-26 DIAGNOSIS — F419 Anxiety disorder, unspecified: Secondary | ICD-10-CM | POA: Diagnosis not present

## 2015-11-26 DIAGNOSIS — Z79899 Other long term (current) drug therapy: Secondary | ICD-10-CM | POA: Diagnosis not present

## 2015-11-26 DIAGNOSIS — I5181 Takotsubo syndrome: Secondary | ICD-10-CM | POA: Insufficient documentation

## 2015-11-26 DIAGNOSIS — R51 Headache: Secondary | ICD-10-CM

## 2015-11-26 LAB — COMPREHENSIVE METABOLIC PANEL
ALBUMIN: 4.5 g/dL (ref 3.5–5.0)
ALT: 12 U/L — ABNORMAL LOW (ref 14–54)
AST: 19 U/L (ref 15–41)
Alkaline Phosphatase: 67 U/L (ref 38–126)
Anion gap: 7 (ref 5–15)
BUN: 12 mg/dL (ref 6–20)
CHLORIDE: 105 mmol/L (ref 101–111)
CO2: 25 mmol/L (ref 22–32)
Calcium: 9.5 mg/dL (ref 8.9–10.3)
Creatinine, Ser: 0.61 mg/dL (ref 0.44–1.00)
GFR calc Af Amer: >60 mL/min (ref >60)
GFR calc non Af Amer: >60 mL/min (ref >60)
GLUCOSE: 123 mg/dL — AB (ref 65–99)
POTASSIUM: 3.4 mmol/L — AB (ref 3.5–5.1)
SODIUM: 137 mmol/L (ref 135–145)
Total Bilirubin: 1 mg/dL (ref 0.3–1.2)
Total Protein: 8.4 g/dL — ABNORMAL HIGH (ref 6.5–8.1)

## 2015-11-26 LAB — TROPONIN I
TROPONIN I: 0.03 ng/mL (ref <0.031)
Troponin I: 0.04 ng/mL — ABNORMAL HIGH (ref <0.031)
Troponin I: 0.04 ng/mL — ABNORMAL HIGH (ref <0.031)

## 2015-11-26 LAB — CBC
HCT: 50 % — ABNORMAL HIGH (ref 35.0–47.0)
Hemoglobin: 17.2 g/dL — ABNORMAL HIGH (ref 12.0–16.0)
MCH: 31.8 pg (ref 26.0–34.0)
MCHC: 34.5 g/dL (ref 32.0–36.0)
MCV: 92.2 fL (ref 80.0–100.0)
Platelets: 171 10*3/uL (ref 150–440)
RBC: 5.42 MIL/uL — ABNORMAL HIGH (ref 3.80–5.20)
RDW: 13.7 % (ref 11.5–14.5)
WBC: 9.9 10*3/uL (ref 3.6–11.0)

## 2015-11-26 MED ORDER — MORPHINE SULFATE (PF) 2 MG/ML IV SOLN
2.0000 mg | INTRAVENOUS | Status: DC | PRN
  Administered 2015-11-26 – 2015-11-27 (×3): 2 mg via INTRAVENOUS
  Filled 2015-11-26 (×3): qty 1

## 2015-11-26 MED ORDER — HYDRALAZINE HCL 20 MG/ML IJ SOLN
10.0000 mg | INTRAMUSCULAR | Status: DC | PRN
  Administered 2015-11-26 (×2): 10 mg via INTRAVENOUS
  Filled 2015-11-26 (×2): qty 1

## 2015-11-26 MED ORDER — HYDRALAZINE HCL 20 MG/ML IJ SOLN
INTRAMUSCULAR | Status: AC
  Administered 2015-11-26: 10 mg via INTRAVENOUS
  Filled 2015-11-26: qty 1

## 2015-11-26 MED ORDER — KETOROLAC TROMETHAMINE 30 MG/ML IJ SOLN
INTRAMUSCULAR | Status: AC
  Administered 2015-11-26: 30 mg via INTRAVENOUS
  Filled 2015-11-26: qty 1

## 2015-11-26 MED ORDER — AMLODIPINE BESYLATE 10 MG PO TABS
10.0000 mg | ORAL_TABLET | Freq: Every day | ORAL | Status: DC
  Administered 2015-11-27: 10 mg via ORAL
  Filled 2015-11-26: qty 1

## 2015-11-26 MED ORDER — LORAZEPAM 2 MG/ML IJ SOLN
2.0000 mg | Freq: Once | INTRAMUSCULAR | Status: AC
  Administered 2015-11-26: 2 mg via INTRAVENOUS
  Filled 2015-11-26: qty 1

## 2015-11-26 MED ORDER — ACETAMINOPHEN 325 MG PO TABS: 650.0000 mg | ORAL_TABLET | Freq: Four times a day (QID) | ORAL | Status: DC | PRN

## 2015-11-26 MED ORDER — ONDANSETRON HCL 4 MG/2ML IJ SOLN
4.0000 mg | Freq: Once | INTRAMUSCULAR | Status: AC
Start: 2015-11-26 — End: 2015-11-26
  Administered 2015-11-26: 4 mg via INTRAVENOUS

## 2015-11-26 MED ORDER — METOCLOPRAMIDE HCL 5 MG PO TABS
5.0000 mg | ORAL_TABLET | Freq: Three times a day (TID) | ORAL | Status: DC
  Administered 2015-11-27: 5 mg via ORAL
  Filled 2015-11-26: qty 1

## 2015-11-26 MED ORDER — ONDANSETRON HCL 4 MG/2ML IJ SOLN: 4.0000 mg | Freq: Once | INTRAMUSCULAR | Status: DC

## 2015-11-26 MED ORDER — BISACODYL 5 MG PO TBEC: 5.0000 mg | DELAYED_RELEASE_TABLET | Freq: Every day | ORAL | Status: DC | PRN

## 2015-11-26 MED ORDER — PANTOPRAZOLE SODIUM 40 MG PO TBEC
40.0000 mg | DELAYED_RELEASE_TABLET | Freq: Two times a day (BID) | ORAL | Status: DC
  Administered 2015-11-27: 40 mg via ORAL
  Filled 2015-11-26: qty 1

## 2015-11-26 MED ORDER — ASPIRIN EC 81 MG PO TBEC
162.0000 mg | DELAYED_RELEASE_TABLET | Freq: Every day | ORAL | Status: DC
  Administered 2015-11-27: 162 mg via ORAL
  Filled 2015-11-26: qty 2

## 2015-11-26 MED ORDER — SODIUM CHLORIDE 0.9 % IV SOLN
INTRAVENOUS | Status: DC
  Administered 2015-11-26 – 2015-11-27 (×2): via INTRAVENOUS

## 2015-11-26 MED ORDER — ONDANSETRON HCL 4 MG/2ML IJ SOLN
4.0000 mg | Freq: Once | INTRAMUSCULAR | Status: AC
  Administered 2015-11-26: 4 mg via INTRAVENOUS
  Filled 2015-11-26: qty 2

## 2015-11-26 MED ORDER — CYCLOBENZAPRINE HCL 10 MG PO TABS: 10.0000 mg | ORAL_TABLET | Freq: Three times a day (TID) | ORAL | Status: DC | PRN

## 2015-11-26 MED ORDER — LORAZEPAM 2 MG/ML IJ SOLN
1.0000 mg | Freq: Once | INTRAMUSCULAR | Status: AC
  Administered 2015-11-26: 1 mg via INTRAVENOUS
  Filled 2015-11-26: qty 1

## 2015-11-26 MED ORDER — GABAPENTIN 300 MG PO CAPS
300.0000 mg | ORAL_CAPSULE | Freq: Three times a day (TID) | ORAL | Status: DC
  Administered 2015-11-27: 300 mg via ORAL
  Filled 2015-11-26: qty 1

## 2015-11-26 MED ORDER — HYDRALAZINE HCL 20 MG/ML IJ SOLN
10.0000 mg | Freq: Once | INTRAMUSCULAR | Status: AC
  Administered 2015-11-26: 10 mg via INTRAVENOUS
  Filled 2015-11-26: qty 1

## 2015-11-26 MED ORDER — HEPARIN SODIUM (PORCINE) 5000 UNIT/ML IJ SOLN
5000.0000 [IU] | Freq: Three times a day (TID) | INTRAMUSCULAR | Status: DC
  Administered 2015-11-26 – 2015-11-27 (×3): 5000 [IU] via SUBCUTANEOUS
  Filled 2015-11-26 (×3): qty 1

## 2015-11-26 MED ORDER — ONDANSETRON HCL 4 MG/2ML IJ SOLN
4.0000 mg | Freq: Four times a day (QID) | INTRAMUSCULAR | Status: DC | PRN
  Administered 2015-11-26: 4 mg via INTRAVENOUS
  Filled 2015-11-26: qty 2

## 2015-11-26 MED ORDER — SODIUM CHLORIDE 0.9 % IV BOLUS (SEPSIS)
1000.0000 mL | Freq: Once | INTRAVENOUS | Status: AC
  Administered 2015-11-26: 1000 mL via INTRAVENOUS

## 2015-11-26 MED ORDER — ALPRAZOLAM 1 MG PO TABS
1.0000 mg | ORAL_TABLET | Freq: Four times a day (QID) | ORAL | Status: DC
  Administered 2015-11-27 (×2): 1 mg via ORAL
  Filled 2015-11-26 (×2): qty 1

## 2015-11-26 MED ORDER — KETOROLAC TROMETHAMINE 30 MG/ML IJ SOLN
30.0000 mg | Freq: Once | INTRAMUSCULAR | Status: AC
  Administered 2015-11-26: 30 mg via INTRAVENOUS

## 2015-11-26 MED ORDER — SERTRALINE HCL 100 MG PO TABS
100.0000 mg | ORAL_TABLET | Freq: Every day | ORAL | Status: DC
  Administered 2015-11-27: 100 mg via ORAL
  Filled 2015-11-26: qty 1

## 2015-11-26 MED ORDER — SODIUM CHLORIDE 0.9% FLUSH
3.0000 mL | Freq: Two times a day (BID) | INTRAVENOUS | Status: DC
  Administered 2015-11-26 (×2): 3 mL via INTRAVENOUS

## 2015-11-26 MED ORDER — CARVEDILOL 12.5 MG PO TABS
12.5000 mg | ORAL_TABLET | Freq: Two times a day (BID) | ORAL | Status: DC
  Administered 2015-11-27: 12.5 mg via ORAL
  Filled 2015-11-26: qty 1

## 2015-11-26 MED ORDER — ATOMOXETINE HCL 40 MG PO CAPS
80.0000 mg | ORAL_CAPSULE | Freq: Every day | ORAL | Status: DC
  Administered 2015-11-27: 80 mg via ORAL
  Filled 2015-11-26: qty 2
  Filled 2015-11-26: qty 1

## 2015-11-26 MED ORDER — LISINOPRIL 10 MG PO TABS
10.0000 mg | ORAL_TABLET | Freq: Two times a day (BID) | ORAL | Status: DC
  Administered 2015-11-27: 10 mg via ORAL
  Filled 2015-11-26: qty 1

## 2015-11-26 MED ORDER — ACETAMINOPHEN 650 MG RE SUPP
650.0000 mg | Freq: Four times a day (QID) | RECTAL | Status: DC | PRN
Start: 2015-11-26 — End: 2015-11-27

## 2015-11-26 MED ORDER — OXYCODONE HCL 5 MG PO TABS: 5.0000 mg | ORAL_TABLET | ORAL | Status: DC | PRN

## 2015-11-26 MED ORDER — SIMVASTATIN 20 MG PO TABS: 20.0000 mg | ORAL_TABLET | Freq: Every day | ORAL | Status: DC

## 2015-11-26 MED ORDER — LORAZEPAM 2 MG/ML IJ SOLN
2.0000 mg | Freq: Four times a day (QID) | INTRAMUSCULAR | Status: DC | PRN
  Administered 2015-11-26 – 2015-11-27 (×2): 2 mg via INTRAVENOUS
  Filled 2015-11-26 (×2): qty 1

## 2015-11-26 MED ORDER — SERTRALINE HCL 25 MG PO TABS: 50.0000 mg | ORAL_TABLET | Freq: Every day | ORAL | Status: DC

## 2015-11-26 MED ORDER — HYDRALAZINE HCL 20 MG/ML IJ SOLN
10.0000 mg | Freq: Once | INTRAMUSCULAR | Status: AC
  Administered 2015-11-26: 10 mg via INTRAVENOUS

## 2015-11-26 MED ORDER — POTASSIUM CHLORIDE CRYS ER 10 MEQ PO TBCR
10.0000 meq | EXTENDED_RELEASE_TABLET | Freq: Every day | ORAL | Status: DC
  Administered 2015-11-27: 10 meq via ORAL
  Filled 2015-11-26 (×3): qty 1

## 2015-11-26 MED ORDER — MAGNESIUM OXIDE 400 (241.3 MG) MG PO TABS
400.0000 mg | ORAL_TABLET | Freq: Every day | ORAL | Status: DC
  Administered 2015-11-27: 400 mg via ORAL
  Filled 2015-11-26: qty 1

## 2015-11-26 MED ORDER — NITROGLYCERIN 2 % TD OINT
1.0000 [in_us] | TOPICAL_OINTMENT | Freq: Four times a day (QID) | TRANSDERMAL | Status: DC
  Administered 2015-11-26 – 2015-11-27 (×4): 1 [in_us] via TOPICAL
  Filled 2015-11-26 (×4): qty 1

## 2015-11-26 MED ORDER — QUETIAPINE FUMARATE 100 MG PO TABS: 200.0000 mg | ORAL_TABLET | Freq: Every day | ORAL | Status: DC

## 2015-11-26 MED ORDER — ONDANSETRON HCL 4 MG PO TABS: 4.0000 mg | ORAL_TABLET | Freq: Four times a day (QID) | ORAL | Status: DC | PRN

## 2015-11-26 MED ORDER — TRAMADOL HCL 50 MG PO TABS
50.0000 mg | ORAL_TABLET | Freq: Two times a day (BID) | ORAL | Status: DC
  Administered 2015-11-27: 50 mg via ORAL
  Filled 2015-11-26: qty 1

## 2015-11-26 NOTE — Progress Notes (Signed)
Pt c/o anxiety. Takes PO Xanax at home, unable to take PO meds at this time due to nausea. MD Dr. Elisabeth Pigeon notified, orders given for Ativan  IV q 6h prn anxiety. RN will continue to monior. Syliva Overman, RN

## 2015-11-26 NOTE — H&P (Signed)
[facility] [place] at [facility]   PATIENT NAME: [name]    MR#:  [id number]  DATE OF BIRTH:  June 09, 1961   DATE OF ADMISSION:  11/26/2015  PRIMARY CARE PHYSICIAN: [name], MD   REQUESTING/REFERRING PHYSICIAN: Paduchowski  CHIEF COMPLAINT:   Chief Complaint  Patient presents with  . Headache  . Emesis    HISTORY OF PRESENT ILLNESS:  [name]  is a 55 y.o. female with a known history of essential hypertension who is presenting with nausea vomiting headache. She states that 3 days ago she began developing URI-like symptoms with associated nausea and vomiting denies fevers or chills at that time. She states she was unable to tolerate her pills. She then complains of headache one-day duration occipital in location stabbing in quality 9/10 intensity no worsening or relieving factors. He presented to the Hospital further workup and evaluation found be markedly hypertensive with systolic blood pressure around 250.  PAST MEDICAL HISTORY:   Past Medical History  Diagnosis Date  . Dyslipidemia   . History of hyperkalemia   . Insomnia   . Malignant hypertension   . Polycythemia     chronic; due to smoking  . Anxiety and depression   . CVA (cerebrovascular accident) 03/2012  . Coronary artery disease nonobstructive    a. 09/2012 NSTEMI/Cath: LM nl, LAD 41m, 20d, LCX  20ost, /m, RCA 50p, 40m, EF 35%;  b. 10/2012 NSTEMI in setting of anxiety, n/v, htn emergency, EF 55% by echo-->felt to be stress induced.  . Stress-induced cardiomyopathy     a. 09/2012 EF 35%;  b. 10/2012 Echo: EF >55%, mod conc LVH, nl RV.  Marland Kitchen Hyperlipidemia   . Agoraphobia   . Hypokalemia     a. 10/2012 hospitalization  . Thrombocytopenia     a. 10/2012 hospitalization  . Obesity     a. previously over 400 lbs.  . Tobacco abuse     a. previously smoked 4 ppd.    PAST SURGICAL HISTORY:   Past Surgical History  Procedure Laterality Date  . Cardiac catheterization   09/2012    [facility]    SOCIAL HISTORY:   Social History  Substance Use Topics  . Smoking status: Current Every Day Smoker -- 1.00 packs/day for 30 years    Types: Cigarettes  . Smokeless tobacco: Not on file  . Alcohol Use: No    FAMILY HISTORY:   Family History  Problem Relation Age of Onset  . Family history unknown: Yes    DRUG ALLERGIES:  No Known Allergies  REVIEW OF SYSTEMS:  REVIEW OF SYSTEMS:  CONSTITUTIONAL: Denies fevers, chills, fatigue, weakness.  EYES: Denies blurred vision, double vision, or eye pain.  EARS, NOSE, THROAT: Denies tinnitus, ear pain, hearing loss.  RESPIRATORY: denies cough, shortness of breath, wheezing  CARDIOVASCULAR: Denies chest pain, palpitations, edema.  GASTROINTESTINAL: Positive nausea, vomiting, denies diarrhea, abdominal pain.  GENITOURINARY: Denies dysuria, hematuria.  ENDOCRINE: Denies nocturia or thyroid problems. HEMATOLOGIC AND LYMPHATIC: Denies easy bruising or bleeding.  SKIN: Denies rash or lesions.  MUSCULOSKELETAL: Denies pain in neck, back, shoulder, knees, hips, or further arthritic symptoms.  NEUROLOGIC: Denies paralysis, paresthesias. Positive headache PSYCHIATRIC: Denies anxiety or depressive symptoms. Otherwise full review of systems performed by me is negative.   MEDICATIONS AT HOME:   Prior to Admission medications   Medication Sig Start Date End Date Taking? Authorizing Provider  ALPRAZolam Prudy Feeler) 1 MG tablet Take 1 mg by mouth 4 (four) times daily.  Yes Historical Provider, MD  cyclobenzaprine (FLEXERIL) 10 MG tablet Take 10 mg by mouth 3 (three) times daily as needed.   Yes Historical Provider, MD  gabapentin (NEURONTIN) 300 MG capsule Take 300 mg by mouth 3 (three) times daily.   Yes Historical Provider, MD  QUEtiapine (SEROQUEL) 200 MG tablet Take 200 mg by mouth at bedtime.   Yes Historical Provider, MD  sertraline (ZOLOFT) 100 MG tablet Take 100 mg by mouth daily.   Yes Historical Provider, MD  traMADol  (ULTRAM) 50 MG tablet Take 50 mg by mouth 2 (two) times daily.   Yes Historical Provider, MD  amLODipine (NORVASC) 10 MG tablet Take 1 tablet (10 mg total) by mouth daily. 04/29/15   Iran Ouch, MD  aspirin 81 MG tablet Takes 2 tablets daily.    Historical Provider, MD  atomoxetine (STRATTERA) 80 MG capsule Take 80 mg by mouth daily.    Historical Provider, MD  bisacodyl (DULCOLAX) 5 MG EC tablet Take 5 mg by mouth daily as needed for constipation.    Historical Provider, MD  carvedilol (COREG) 12.5 MG tablet TAKE 1 TABLET BY MOUTH TWICE DAILY WITH A MEAL 04/29/15   Iran Ouch, MD  lisinopril (PRINIVIL,ZESTRIL) 10 MG tablet Take 1 tablet (10 mg total) by mouth 2 (two) times daily. 03/20/15   Iran Ouch, MD  magnesium oxide (MAG-OX) 400 (241.3 MG) MG tablet Take 1 tablet (400 mg total) by mouth daily. 05/16/15   Alford Highland, MD  metoCLOPramide (REGLAN) 5 MG tablet Take 5 mg by mouth 3 (three) times daily.    Historical Provider, MD  pantoprazole (PROTONIX) 40 MG tablet Take 1 tablet (40 mg total) by mouth 2 (two) times daily. 12/09/12   Antonieta Iba, MD  potassium chloride (K-DUR) 10 MEQ tablet Take 1 tablet (10 mEq total) by mouth daily. 05/16/15   Alford Highland, MD  sertraline (ZOLOFT) 50 MG tablet Take 50 mg by mouth daily.    Historical Provider, MD  simvastatin (ZOCOR) 20 MG tablet Take 1 tablet (20 mg total) by mouth at bedtime. 04/29/15   Iran Ouch, MD      VITAL SIGNS:  Blood pressure 200/158, pulse 102, temperature 98.9 F (37.2 C), resp. rate 19, height 5\' 6"  (1.676 m), weight 220 lb (99.791 kg), SpO2 98 %.  PHYSICAL EXAMINATION:  VITAL SIGNS: Filed Vitals:   11/26/15 1300 11/26/15 1330  BP: 201/171 200/158  Pulse:    Temp:    Resp: 14 19   GENERAL:54 y.o.female currently in no acute distress.  HEAD: Normocephalic, atraumatic.  EYES: Pupils equal, round, reactive to light. Extraocular muscles intact. No scleral icterus.  MOUTH: Moist mucosal  membrane. Dentition intact. No abscess noted.  EAR, NOSE, THROAT: Clear without exudates. No external lesions.  NECK: Supple. No thyromegaly. No nodules. No JVD.  PULMONARY: Clear to ascultation, without wheeze rails or rhonci. No use of accessory muscles, Good respiratory effort. good air entry bilaterally CHEST: Nontender to palpation.  CARDIOVASCULAR: S1 and S2. Regular rate and rhythm. No murmurs, rubs, or gallops. No edema. Pedal pulses 2+ bilaterally.  GASTROINTESTINAL: Soft, nontender, nondistended. No masses. Positive bowel sounds. No hepatosplenomegaly.  MUSCULOSKELETAL: No swelling, clubbing, or edema. Range of motion full in all extremities.  NEUROLOGIC: Cranial nerves II through XII are intact. No gross focal neurological deficits. Sensation intact. Reflexes intact.  SKIN: No ulceration, lesions, rashes, or cyanosis. Skin warm and dry. Turgor intact.  PSYCHIATRIC: Mood, affect within normal limits. The patient  is awake, alert and oriented x 3. Insight, judgment intact.    LABORATORY PANEL:   CBC  Recent Labs Lab 11/26/15 0840  WBC 9.9  HGB 17.2*  HCT 50.0*  PLT 171   ------------------------------------------------------------------------------------------------------------------  Chemistries   Recent Labs Lab 11/26/15 0840  NA 137  K 3.4*  CL 105  CO2 25  GLUCOSE 123*  BUN 12  CREATININE 0.61  CALCIUM 9.5  AST 19  ALT 12*  ALKPHOS 67  BILITOT 1.0   ------------------------------------------------------------------------------------------------------------------  Cardiac Enzymes  Recent Labs Lab 11/26/15 0840  TROPONINI 0.03   ------------------------------------------------------------------------------------------------------------------  RADIOLOGY:  Ct Head Wo Contrast  11/26/2015  CLINICAL DATA:  Occipital headache with nausea for 2 days, no known injury, history dyslipidemia, malignant hypertension, coronary artery disease, stress induced  cardiomyopathy, stroke, smoker EXAM: CT HEAD WITHOUT CONTRAST TECHNIQUE: Contiguous axial images were obtained from the base of the skull through the vertex without intravenous contrast. COMPARISON:  09/03/2013 FINDINGS: Normal ventricular morphology. No midline shift or mass effect. Prominent perivascular spaces at basal ganglia stable. Otherwise normal appearance of brain parenchyma. No intracranial hemorrhage, mass lesion or evidence acute infarction. No extra-axial fluid collections. Mucosal retention cyst at floor of LEFT maxillary sinus. Bones and sinuses otherwise unremarkable. IMPRESSION: No acute intracranial abnormalities. Mucosal retention cysts LEFT maxillary sinus. Electronically Signed   By: Ulyses Southward M.D.   On: 11/26/2015 12:14    EKG:   Orders placed or performed during the hospital encounter of 05/15/15  . EKG 12-Lead  . EKG 12-Lead  . EKG    IMPRESSION AND PLAN:   54 year old Caucasian female history of essential hypertension presenting with nausea vomiting  headache  1. Hypertensive urgency: Given headache, patient symptoms have improved however her blood pressure remains remarkably elevated. Goal will be to decrease her blood pressure by 20-25% which is goal systolic blood pressure around 200. Restart her home medication Norvasc, Coreg, lisinopril, she follows with Dr. Kirke Corin for cardiology consult them  2. Intractable nausea vomiting: Likely in setting of headaches/viral illness when necessary medications supportive care 3. Hyperlipidemia unspecified: Statin therapy 4. GERD without esophagitis: PPI therapy 5. Venous thromboembolism prophylactic: Heparin subcutaneous  Patient appears to have bed bugs place on contact precautions   All the records are reviewed and case discussed with ED provider. Management plans discussed with the patient, family and they are in agreement.  CODE STATUS: Full  TOTAL TIME TAKING CARE OF THIS PATIENT: 35 minutes.    Hower,  Mardi Mainland.D on 11/26/2015 at 1:49 PM  Between 7am to 6pm - Pager - [phone]  After 6pm: House Pager: - [phone]  [facility]  Office  [phone]  CC: Primary care physician; [name], MD

## 2015-11-26 NOTE — ED Notes (Signed)
Pt reports headache and vomiting since yesterday; EMS reports pt's BP has been high and then low, CBG 90, gave  of zofran IM en route.

## 2015-11-26 NOTE — ED Provider Notes (Signed)
[facility] Emergency Department Provider Note  Time seen: 8:40 AM  I have reviewed the triage vital signs and the nursing notes.   HISTORY  Chief Complaint Headache and Emesis    HPI [name] is a 55 y.o. female with a past medical history of hyperlipidemia, hypertension, CVA, obesity, who presents to the emergency department with headache, nausea, vomiting and an elevated blood pressure. According to the patient she states she has gone through this several times where she will get hypertensive, get a headache, get nauseated and started vomiting. Patient has been told it is her anxiety but caused her to do this. Patient is prescribed Xanax for anxiety as well as blood pressure medications, however she states when she gets like this she cannot keep down any of her medications so she has to come to the emergency department for treatment. Patient describes her headache as moderate, occipital in location. Describes her nausea is moderate. Has vomited several times since last night. Patient's blood pressure currently 205/133.     Past Medical History  Diagnosis Date  . Dyslipidemia   . History of hyperkalemia   . Insomnia   . Malignant hypertension   . Polycythemia     chronic; due to smoking  . Anxiety and depression   . CVA (cerebrovascular accident) 03/2012  . Coronary artery disease nonobstructive    a. 09/2012 NSTEMI/Cath: LM nl, LAD 53m, 20d, LCX  20ost, /m, RCA 50p, 28m, EF 35%;  b. 10/2012 NSTEMI in setting of anxiety, n/v, htn emergency, EF 55% by echo-->felt to be stress induced.  . Stress-induced cardiomyopathy     a. 09/2012 EF 35%;  b. 10/2012 Echo: EF >55%, mod conc LVH, nl RV.  Marland Kitchen Hyperlipidemia   . Agoraphobia   . Hypokalemia     a. 10/2012 hospitalization  . Thrombocytopenia     a. 10/2012 hospitalization  . Obesity     a. previously over 400 lbs.  . Tobacco abuse     a. previously smoked 4 ppd.    Patient Active Problem List   Diagnosis  Date Noted  . Hyponatremia 05/15/2015  . Leg pain 05/12/2013  . Tobacco abuse   . Obesity   . Thrombocytopenia (HCC)   . Hypokalemia   . Agoraphobia   . Hyperlipidemia   . Stress-induced cardiomyopathy   . Coronary artery disease   . Anxiety and depression   . Malignant hypertension   . Dyslipidemia     Past Surgical History  Procedure Laterality Date  . Cardiac catheterization  09/2012    [facility]    Current Outpatient Rx  Name  Route  Sig  Dispense  Refill  . alprazolam (XANAX) 2 MG tablet   Oral   Take 2 mg by mouth at bedtime as needed.         Marland Kitchen amLODipine (NORVASC) 10 MG tablet   Oral   Take 1 tablet (10 mg total) by mouth daily.   90 tablet   3   . aspirin 81 MG tablet      Takes 2 tablets daily.         Marland Kitchen atomoxetine (STRATTERA) 80 MG capsule   Oral   Take 80 mg by mouth daily.         . bisacodyl (DULCOLAX) 5 MG EC tablet   Oral   Take 5 mg by mouth daily as needed for constipation.         . carvedilol (COREG) 12.5 MG tablet  TAKE 1 TABLET BY MOUTH TWICE DAILY WITH A MEAL   180 tablet   3   . lisinopril (PRINIVIL,ZESTRIL) 10 MG tablet   Oral   Take 1 tablet (10 mg total) by mouth 2 (two) times daily.   60 tablet   6   . magnesium oxide (MAG-OX) 400 (241.3 MG) MG tablet   Oral   Take 1 tablet (400 mg total) by mouth daily.   7 tablet   0   . metoCLOPramide (REGLAN) 5 MG tablet   Oral   Take 5 mg by mouth 3 (three) times daily.         . pantoprazole (PROTONIX) 40 MG tablet   Oral   Take 1 tablet (40 mg total) by mouth 2 (two) times daily.   60 tablet   6   . potassium chloride (K-DUR) 10 MEQ tablet   Oral   Take 1 tablet (10 mEq total) by mouth daily.   7 tablet   0   . sertraline (ZOLOFT) 50 MG tablet   Oral   Take 50 mg by mouth daily.         . simvastatin (ZOCOR) 20 MG tablet   Oral   Take 1 tablet (20 mg total) by mouth at bedtime.   90 tablet   3     Allergies Review of patient's allergies  indicates no known allergies.  Family History  Problem Relation Age of Onset  . Family history unknown: Yes    Social History Social History  Substance Use Topics  . Smoking status: Current Every Day Smoker -- 1.00 packs/day for 30 years    Types: Cigarettes  . Smokeless tobacco: Not on file  . Alcohol Use: No    Review of Systems Constitutional: Negative for fever. Cardiovascular: Negative for chest pain. Respiratory: Negative for shortness of breath. Gastrointestinal: Negative for abdominal pain Neurological: Negative for headache 10-point ROS otherwise negative.  ____________________________________________   PHYSICAL EXAM:  VITAL SIGNS: ED Triage Vitals  Enc Vitals Group     BP 11/26/15 0833 205/133 mmHg     Pulse Rate 11/26/15 0833 85     Resp 11/26/15 0833 16     Temp 11/26/15 0833 98.9 F (37.2 C)     Temp src --      SpO2 11/26/15 0833 98 %     Weight 11/26/15 0833 220 lb (99.791 kg)     Height 11/26/15 0833  (1.676 m)     Head Cir --      Peak Flow --      Pain Score 11/26/15 0834 10     Pain Loc --      Pain Edu? --      Excl. in GC? --     Constitutional: Alert and oriented. Well appearing and in no distress. Eyes: Normal exam ENT   Head: Normocephalic and atraumatic.   Mouth/Throat: Mucous membranes are moist. Cardiovascular: Normal rate, regular rhythm. No murmur Respiratory: Normal respiratory effort without tachypnea nor retractions. Breath sounds are clear  Gastrointestinal: Soft and nontender. No distention.   Musculoskeletal: Nontender with normal range of motion in all extremities. Neurologic:  Normal speech and language. No gross focal neurologic deficits. PERRLA. Cranial nerves intact. Skin:  Skin is warm, dry and intact.  Psychiatric: Mood and affect are normal. Speech and behavior are normal.   ____________________________________________     INITIAL IMPRESSION / ASSESSMENT AND PLAN / ED COURSE  Pertinent labs &  imaging results that  were available during my care of the patient were reviewed by me and considered in my medical decision making (see chart for details).  Patient presents with headache, nausea, vomiting and hypertension since yesterday. According to the patient as his happened many times in the past, she has been diagnosed with malignant hypertension in the past. Patient states usually once her anxiety is controlled she is able to take her medications without vomiting, and the symptoms will resolve. Currently she describes a moderate occipital headache, nausea. Of note the patient was also found to have bedbugs on her person, 3 or 4 have already been removed. I placed the patient on contact precautions.  EKG reviewed and interpreted by myself shows normal sinus rhythm at 85 bpm, narrow QRS, left axis deviation, nonspecific ST changes. No ST elevations.  Patient continues with significant hypertension, 247/139 currently. Patient has received IV Ativan, Zofran, fluids, IV hydralazine. Patient continues with nausea and headache. We'll obtain a CT head. We will dose a second round of IV hydralazine and continue to monitor in the emergency department.  Patient continues with nausea and vomiting after several rounds of antiemetics. Patient continues with hypertension 190/130 after several rounds of IV hydralazine. Patient will be admitted to the hospital for further treatment.  ____________________________________________   FINAL CLINICAL IMPRESSION(S) / ED DIAGNOSES  Malignant hypertension Headache Nausea and vomiting  Minna Antis, MD 11/26/15 1205

## 2015-11-27 DIAGNOSIS — R112 Nausea with vomiting, unspecified: Secondary | ICD-10-CM

## 2015-11-27 DIAGNOSIS — R7989 Other specified abnormal findings of blood chemistry: Secondary | ICD-10-CM

## 2015-11-27 DIAGNOSIS — I119 Hypertensive heart disease without heart failure: Secondary | ICD-10-CM

## 2015-11-27 DIAGNOSIS — I160 Hypertensive urgency: Secondary | ICD-10-CM | POA: Diagnosis not present

## 2015-11-27 LAB — CBC
HCT: 52.3 % — ABNORMAL HIGH (ref 35.0–47.0)
Hemoglobin: 17.4 g/dL — ABNORMAL HIGH (ref 12.0–16.0)
MCH: 31.5 pg (ref 26.0–34.0)
MCHC: 33.3 g/dL (ref 32.0–36.0)
MCV: 94.6 fL (ref 80.0–100.0)
PLATELETS: 177 10*3/uL (ref 150–440)
RBC: 5.53 MIL/uL — AB (ref 3.80–5.20)
RDW: 13.8 % (ref 11.5–14.5)
WBC: 12.5 10*3/uL — AB (ref 3.6–11.0)

## 2015-11-27 LAB — BASIC METABOLIC PANEL
Anion gap: 10 (ref 5–15)
BUN: 14 mg/dL (ref 6–20)
CALCIUM: 9 mg/dL (ref 8.9–10.3)
CO2: 22 mmol/L (ref 22–32)
CREATININE: 0.7 mg/dL (ref 0.44–1.00)
Chloride: 105 mmol/L (ref 101–111)
GFR calc non Af Amer: >60 mL/min (ref >60)
Glucose, Bld: 104 mg/dL — ABNORMAL HIGH (ref 65–99)
Potassium: 2.7 mmol/L — CL (ref 3.5–5.1)
SODIUM: 137 mmol/L (ref 135–145)

## 2015-11-27 LAB — MAGNESIUM: MAGNESIUM: 1.9 mg/dL (ref 1.7–2.4)

## 2015-11-27 LAB — POTASSIUM: Potassium: 4.1 mmol/L (ref 3.5–5.1)

## 2015-11-27 MED ORDER — POTASSIUM CHLORIDE 10 MEQ/100ML IV SOLN
10.0000 meq | INTRAVENOUS | Status: AC
  Administered 2015-11-27 (×4): 10 meq via INTRAVENOUS
  Filled 2015-11-27 (×4): qty 100

## 2015-11-27 MED ORDER — POTASSIUM CHLORIDE ER 10 MEQ PO TBCR: EXTENDED_RELEASE_TABLET | ORAL | Status: DC

## 2015-11-27 MED ORDER — POTASSIUM CHLORIDE CRYS ER 20 MEQ PO TBCR
40.0000 meq | EXTENDED_RELEASE_TABLET | Freq: Once | ORAL | Status: AC
  Administered 2015-11-27: 40 meq via ORAL
  Filled 2015-11-27: qty 2

## 2015-11-27 NOTE — Progress Notes (Signed)
Pt discharged to home via wc.  Instructions  given to pt.  Questions answered.  No distress.  

## 2015-11-27 NOTE — Consult Note (Signed)
Cardiology Consult    Patient ID: [name] MRN: [id number], DOB/AGE: [birth year]   Admit date: 11/26/2015 Date of Consult: 11/27/2015  Primary Physician: [name], MD Reason for Consult: Hypertensive Urgency Primary Cardiologist: Dr. Kirke Corin Requesting Provider: Dr. Clint Guy   History of Present Illness    [name] is a 55 y.o. 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   She reports developing nausea and vomiting over the weekend and was unable to keep down food initially but then could not keep down liquids starting on 11/25/2015. She was therefore unable to take any of her oral medications. She developed a severe headache yesterday which prompted her to come to the ED for evaluation.  Her initial BP in the ED was 247/139. She was given IV Ativan, IV Zofran, and IV Hydralazine and continued to have a headache and nausea. She was given additional doses of IV Hydralazine with her BP at 190/130 at the time of admission. Head CT was obtained which showed no acute intracranial abnormalities.   Cyclic troponin values have been obtained and are 0.03, 0.04, and 0.04. CBC showed WBC of 12.5, Hgb of 17.4, and Platelets of 177. She was hypokalemic at 2.7 this morning and her potassium is being appropriately replaced.  After receiving IV Hydralazine overnight, her BP has improved to 167/101 at the time of her most recent reading. She reports her nausea has resolved and she was able to keep down her coffee and grits for breakfast. She reports still having a mild headache in the occipital region, but says it has greatly improved since admission. She denies any vision changes, pre-syncope, palpitations, chest pain, or dyspnea over  the past several days. She notes being an "anxoius person" and is worried about her children at home. She is also worried about missing work, as she just started a new job.    Past Medical History   Past Medical History  Diagnosis Date  . Dyslipidemia   . History of hyperkalemia   . Insomnia   . Malignant hypertension   . Polycythemia     chronic; due to smoking  . Anxiety and depression   . CVA (cerebrovascular accident) (HCC) 03/2012  . Coronary artery disease nonobstructive    a. 09/2012 NSTEMI/Cath: LM nl, LAD 70m, 20d, LCX  20ost, /m, RCA 50p, 39m, EF 35%;  b. 10/2012 NSTEMI in setting of anxiety, n/v, htn emergency, EF 55% by echo-->felt to be stress induced.  . Stress-induced cardiomyopathy     a. 09/2012 EF 35%;  b. 10/2012 Echo: EF >55%, mod conc LVH, nl RV.  Marland Kitchen Hyperlipidemia   . Agoraphobia   . Hypokalemia     a. 10/2012 hospitalization  . Thrombocytopenia (HCC)     a. 10/2012 hospitalization  . Obesity     a. previously over 400 lbs.  . Tobacco abuse     a. previously smoked 4 ppd.    Past Surgical History  Procedure Laterality Date  . Cardiac catheterization  09/2012    [facility]     Allergies: No Known Allergies  Inpatient Medications    . ALPRAZolam  1 mg Oral QID  . amLODipine  10 mg Oral Daily  . aspirin EC  162 mg Oral Daily  .  atomoxetine  80 mg Oral Daily  . carvedilol  12.5 mg Oral BID WC  . gabapentin  300 mg Oral TID  . heparin  5,000 Units Subcutaneous 3 times per day  . lisinopril  10 mg Oral BID  . magnesium oxide  400 mg Oral Daily  . metoCLOPramide  5 mg Oral TID  . nitroGLYCERIN  1 inch Topical 4 times per day  . pantoprazole  40 mg Oral BID  . potassium chloride  10 mEq Oral Daily  . potassium chloride  10 mEq Intravenous Q1 Hr x 4  . QUEtiapine  200 mg Oral QHS  . sertraline  100 mg Oral Daily  . simvastatin  20 mg Oral QHS  . sodium chloride flush  3 mL Intravenous Q12H  . traMADol  50 mg Oral BID    Family History    Family History   Problem Relation Age of Onset  . Family history unknown: Yes    Social History    Social History   Social History  . Marital Status: Widowed    Spouse Name: N/A  . Number of Children: N/A  . Years of Education: N/A   Occupational History  . Not on file.   Social History Main Topics  . Smoking status: Current Every Day Smoker -- 1.00 packs/day for 30 years    Types: Cigarettes  . Smokeless tobacco: Not on file  . Alcohol Use: No  . Drug Use: No  . Sexual Activity: Not on file   Other Topics Concern  . Not on file   Social History Narrative     Review of Systems    General:  No chills, fever, night sweats or weight changes.  Cardiovascular:  No chest pain, dyspnea on exertion, edema, orthopnea, palpitations, paroxysmal nocturnal dyspnea. Dermatological: No rash, lesions/masses Respiratory: No cough, dyspnea Urologic: No hematuria, dysuria Abdominal:   No diarrhea, bright red blood per rectum, melena, or hematemesis. Positive for nausea and vomiting. Neurologic:  No visual changes, wkns, changes in mental status. Positive for headaches. All other systems reviewed and are otherwise negative except as noted above.  Physical Exam    Blood pressure 167/101, pulse 99, temperature 98.8 F (37.1 C), temperature source Oral, resp. rate 16, height  (1.676 m), weight 220 lb (99.791 kg), SpO2 97 %.  General: Pleasant, Caucasian female appearing in NAD Psych: Normal affect. Neuro: Alert and oriented X 3. Moves all extremities spontaneously. HEENT: Normal  Neck: Supple without bruits or JVD. Lungs:  Resp regular and unlabored, CTA without wheezing or rales. Heart: RRR no s3, s4, or murmurs. Abdomen: Soft, non-tender, non-distended, BS + x 4.  Extremities: No clubbing, cyanosis or edema. DP/PT/Radials 2+ and equal bilaterally.  Labs    Troponin (Point of Care Test) No results for input(s): TROPIPOC in the last 72 hours.  Recent Labs  11/26/15 0840 11/26/15 1549  11/26/15 2203  TROPONINI 0.03 0.04* 0.04*   Lab Results  Component Value Date   WBC 12.5* 11/27/2015   HGB 17.4* 11/27/2015   HCT 52.3* 11/27/2015   MCV 94.6 11/27/2015   PLT 177 11/27/2015    Recent Labs Lab 11/26/15 0840 11/27/15 0411  NA 137 137  K 3.4* 2.7*  CL 105 105  CO2 25 22  BUN 12 14  CREATININE 0.61 0.70  CALCIUM 9.5 9.0  PROT 8.4*  --   BILITOT 1.0  --   ALKPHOS 67  --   ALT 12*  --   AST  19  --   GLUCOSE 123* 104*   Lab Results  Component Value Date   CHOL 211* 09/22/2012   HDL 61* 09/22/2012   LDLCALC 124* 09/22/2012   TRIG 128 09/22/2012   No results found for: [facility]   Radiology Studies    Ct Head Wo Contrast: 11/26/2015  CLINICAL DATA:  Occipital headache with nausea for 2 days, no known injury, history dyslipidemia, malignant hypertension, coronary artery disease, stress induced cardiomyopathy, stroke, smoker EXAM: CT HEAD WITHOUT CONTRAST TECHNIQUE: Contiguous axial images were obtained from the base of the skull through the vertex without intravenous contrast. COMPARISON:  09/03/2013 FINDINGS: Normal ventricular morphology. No midline shift or mass effect. Prominent perivascular spaces at basal ganglia stable. Otherwise normal appearance of brain parenchyma. No intracranial hemorrhage, mass lesion or evidence acute infarction. No extra-axial fluid collections. Mucosal retention cyst at floor of LEFT maxillary sinus. Bones and sinuses otherwise unremarkable. IMPRESSION: No acute intracranial abnormalities. Mucosal retention cysts LEFT maxillary sinus. Electronically Signed   By: Ulyses Southward M.D.   On: 11/26/2015 12:14    EKG & Cardiac Imaging    EKG: No tracing this admission. Will obtain.  Echocardiogram: 10/2012     Assessment & Plan    1. Hypertensive Urgency - developed N/V over the weekend and was unable to keep down her medications. Developed a severe headache and her BP was initially 247/139 when seen in the ED on 11/26/2015. - started  on IV Hydralazine and her most recent BP was 167/101 this morning.  - able to tolerate her AM dose of Coreg 12.5mg . Scheduled to receive Amlodipine 10mg  and Lisinopril 10mg  this morning. The patient is very anxious to be discharged in wanting to return to her family and work. She understands we will need to monitor her BP after receiving her oral medications this morning and make sure it responds appropriately. Perhaps she can be an afternoon discharge if her vitals remain stable and her nausea is resolved.  - Encouraged with patient to keep a log of her BP readings after discharge and to bring that log to her Cardiology follow-up appointment so we can best adjust her medications if needed.   2. CAD - cardiac catheterization in 09/2012 showed a 50% stenosis in the Proximal RCA, 40% mid-LAD, 20% in the Cx. - will obtain an EKG. Denies any recent anginal symptoms.  - Troponin values minimally elevated this admission with a peak of 0.04. Likely due to demand ischemia in the setting of hypertensive urgency. - continue ASA, statin, BB, and ACE-I.  3. HLD - continue statin therapy once able to tolerate PO medications.  4. Nausea and Vomiting - started this weekend. Likely or viral etiology - currently resolved after receiving IV Zofran.  5. Hypokalemia - Potassium 2.7 this morning. - currently being replaced.  Signed, Ellsworth Lennox, PA-C 11/27/2015, 8:16 AM Pager: [phone]   I have seen, examined and evaluated the patient this AM along with Ms. Iran Ouch, PA-C.  After reviewing all the available data and chart,  I agree with her findings, examination as well as impression recommendations.  Very pleasant woman with a history of significant hypertension and hypertensive heart disease (moderate disease by cath) who presented with hypertensive emergency/accelerated hypertension the setting of what is likely a viral syndrome leading to nausea and vomiting. She was unable to take her blood  pressure medications for couple days.  This is not the first time for this to happen. It is a relatively routine condition maybe once  a year or so. Usually if her nausea is taking care of she is able to get back on her blood pressure medications and blood pressures controlled. Currently her blood pressure is 160/125 which had obvious histologically possible. She feels much better. Nausea much improved.  Currently potassium being replaced with hopes that this will be restored. This is clearly from her nausea and vomiting.  From a cardiology standpoint, will simply just try to restart her blood pressure medications that she is on at home. She was due to see Dr. Kirke Corin in follow-up, this can be rescheduled.  For future reference, may be best to have again plan in place or similar occurrences in the future. Possibly Phenergan suppositories to avoid hospitalization if not if she comes emergency room treating her hypertension and nausea may preclude the need for overnight observation.  She is not having any heart failure or anginal symptoms. Elevated troponins clearly related to accelerated hypertension. Would not pursue further evaluation.  My understanding is provided her renal function remained stable and her potassium normalizes, that she will likely be discharged later today.   Marykay Lex, M.D., M.S. Interventional Cardiologist   Pager # [phone] Phone # [phone] [address] [place], [place] [address]

## 2015-11-27 NOTE — Discharge Summary (Signed)
[facility] - [place] at [facility]   PATIENT NAME: [name]    MR#:  [id number]  DATE OF BIRTH:  [birth year]  DATE OF ADMISSION:  11/26/2015 ADMITTING PHYSICIAN: Wyatt Haste, MD  DATE OF DISCHARGE: 11/27/2015  PRIMARY CARE PHYSICIAN: [name], MD    ADMISSION DIAGNOSIS:  Essential hypertension [I10] Acute nonintractable headache, unspecified headache type [R51] Intractable vomiting with nausea, vomiting of unspecified type [R11.10]  DISCHARGE DIAGNOSIS:  Principal Problem:   Hypertensive urgency Active Problems:   Hypokalemia   Coronary artery disease   SECONDARY DIAGNOSIS:   Past Medical History  Diagnosis Date  . Dyslipidemia   . History of hyperkalemia   . Insomnia   . Malignant hypertension   . Polycythemia     chronic; due to smoking  . Anxiety and depression   . CVA (cerebrovascular accident) (HCC) 03/2012  . Coronary artery disease nonobstructive    a. 09/2012 NSTEMI/Cath: LM nl, LAD 57m, 20d, LCX  20ost, /m, RCA 50p, 66m, EF 35%;  b. 10/2012 NSTEMI in setting of anxiety, n/v, htn emergency, EF 55% by echo-->felt to be stress induced.  . Stress-induced cardiomyopathy     a. 09/2012 EF 35%;  b. 10/2012 Echo: EF >55%, mod conc LVH, nl RV.  Marland Kitchen Hyperlipidemia   . Agoraphobia   . Hypokalemia     a. 10/2012 hospitalization  . Thrombocytopenia (HCC)     a. 10/2012 hospitalization  . Obesity     a. previously over 400 lbs.  . Tobacco abuse     a. previously smoked 4 ppd.    HOSPITAL COURSE:   1. Viral gastroenteritis with nausea vomiting but no diarrhea. This has resolved and she tolerated normal diet prior to going home. 2. Hypertensive urgency. She was placed back on her usual medications and her blood pressure is better upon discharge. 3. Hypokalemia likely with nausea vomiting. Prescribe potassium upon discharge home. 4. Anxiety depression on psychiatric medications 5. Hyperlipidemia unspecified on  simvastatin  DISCHARGE CONDITIONS:   Satisfactory  CONSULTS OBTAINED:  Treatment Team:  Wyatt Haste, MD Iran Ouch, MD  DRUG ALLERGIES:  No Known Allergies  DISCHARGE MEDICATIONS:   Current Discharge Medication List    CONTINUE these medications which have CHANGED   Details  potassium chloride (K-DUR) 10 MEQ tablet Take 1 tablet twice a day for 3 days, then 1 tablet daily afterwards. Qty: 30 tablet, Refills: 0      CONTINUE these medications which have NOT CHANGED   Details  ALPRAZolam (XANAX) 1 MG tablet Take 1 mg by mouth 4 (four) times daily. Refills: 2    aspirin 81 MG tablet Takes 2 tablets daily.    bisacodyl (DULCOLAX) 5 MG EC tablet Take 5 mg by mouth daily as needed for constipation.    cyclobenzaprine (FLEXERIL) 10 MG tablet Take 10 mg by mouth 3 (three) times daily as needed. Refills: 2    gabapentin (NEURONTIN) 300 MG capsule Take 300 mg by mouth 3 (three) times daily. Refills: 5    magnesium oxide (MAG-OX) 400 (241.3 MG) MG tablet Take 1 tablet (400 mg total) by mouth daily. Qty: 7 tablet, Refills: 0    QUEtiapine (SEROQUEL) 200 MG tablet Take 200 mg by mouth at bedtime. Refills: 5    sertraline (ZOLOFT) 100 MG tablet Take 100 mg by mouth daily. Refills: 5    simvastatin (ZOCOR) 20 MG tablet Take 1 tablet (20 mg total) by mouth at bedtime. Qty: 90 tablet, Refills:  3    traMADol (ULTRAM) 50 MG tablet Take 50 mg by mouth 2 (two) times daily. Refills: 2    amLODipine (NORVASC) 10 MG tablet Take 1 tablet (10 mg total) by mouth daily. Qty: 90 tablet, Refills: 3    carvedilol (COREG) 12.5 MG tablet TAKE 1 TABLET BY MOUTH TWICE DAILY WITH A MEAL Qty: 180 tablet, Refills: 3    lisinopril (PRINIVIL,ZESTRIL) 10 MG tablet Take 1 tablet (10 mg total) by mouth 2 (two) times daily. Qty: 60 tablet, Refills: 6      STOP taking these medications     atomoxetine (STRATTERA) 80 MG capsule      metoCLOPramide (REGLAN) 5 MG tablet       pantoprazole (PROTONIX) 40 MG tablet          DISCHARGE INSTRUCTIONS:   Follow-up PMD one week recheck electrolytes and blood pressure Follow-up with Dr. Kirke Corin as outpatient  If you experience worsening of your admission symptoms, develop shortness of breath, life threatening emergency, suicidal or homicidal thoughts you must seek medical attention immediately by calling 911 or calling your MD immediately  if symptoms less severe.  You Must read complete instructions/literature along with all the possible adverse reactions/side effects for all the Medicines you take and that have been prescribed to you. Take any new Medicines after you have completely understood and accept all the possible adverse reactions/side effects.   Please note  You were cared for by a hospitalist during your hospital stay. If you have any questions about your discharge medications or the care you received while you were in the hospital after you are discharged, you can call the unit and asked to speak with the hospitalist on call if the hospitalist that took care of you is not available. Once you are discharged, your primary care physician will handle any further medical issues. Please note that NO REFILLS for any discharge medications will be authorized once you are discharged, as it is imperative that you return to your primary care physician (or establish a relationship with a primary care physician if you do not have one) for your aftercare needs so that they can reassess your need for medications and monitor your lab values.    Today   CHIEF COMPLAINT:   Chief Complaint  Patient presents with  . Headache  . Emesis    HISTORY OF PRESENT ILLNESS:  [name]  is a 55 y.o. female presented with nausea vomiting and found to have accelerated hypertension   VITAL SIGNS:  Blood pressure 134/81, pulse 79, temperature 98.6 F (37 C), temperature source Oral, resp. rate 16, height  (1.676 m), weight  99.791 kg (220 lb), SpO2 76 %.  Pulse ox of 76% is in error it was 96%  PHYSICAL EXAMINATION:  GENERAL:  55 y.o.-year-old patient lying in the bed with no acute distress.  EYES: Pupils equal, round, reactive to light and accommodation. No scleral icterus. Extraocular muscles intact.  HEENT: Head atraumatic, normocephalic. Oropharynx and nasopharynx clear.  NECK:  Supple, no jugular venous distention. No thyroid enlargement, no tenderness.  LUNGS: Normal breath sounds bilaterally, no wheezing, rales,rhonchi or crepitation. No use of accessory muscles of respiration.  CARDIOVASCULAR: S1, S2 normal. No murmurs, rubs, or gallops.  ABDOMEN: Soft, non-tender, non-distended. Bowel sounds present. No organomegaly or mass.  EXTREMITIES: No pedal edema, cyanosis, or clubbing.  NEUROLOGIC: Cranial nerves II through XII are intact. Muscle strength 5/5 in all extremities. Sensation intact. Gait not checked.  PSYCHIATRIC: The  patient is alert and oriented x 3.  SKIN: No obvious rash, lesion, or ulcer.   DATA REVIEW:   CBC  Recent Labs Lab 11/27/15 0411  WBC 12.5*  HGB 17.4*  HCT 52.3*  PLT 177    Chemistries   Recent Labs Lab 11/26/15 0840 11/27/15 0411 11/27/15 1408  NA 137 137  --   K 3.4* 2.7* 4.1  CL 105 105  --   CO2 25 22  --   GLUCOSE 123* 104*  --   BUN 12 14  --   CREATININE 0.61 0.70  --   CALCIUM 9.5 9.0  --   MG  --  1.9  --   AST 19  --   --   ALT 12*  --   --   ALKPHOS 67  --   --   BILITOT 1.0  --   --     Cardiac Enzymes  Recent Labs Lab 11/26/15 2203  TROPONINI 0.04*    Microbiology Results  Results for orders placed or performed in visit on 10/21/12  Influenza A&B Antigens [facility])     Status: None   Collection Time: 10/21/12  7:30 PM  Result Value Ref Range Status   Micro Text Report   Final       COMMENT                   NEGATIVE FOR INFLUENZA A (ANTIGEN ABSENT)   COMMENT                   NEGATIVE FOR INFLUENZA B (ANTIGEN ABSENT)    ANTIBIOTIC                                                        RADIOLOGY:  Ct Head Wo Contrast  11/26/2015  CLINICAL DATA:  Occipital headache with nausea for 2 days, no known injury, history dyslipidemia, malignant hypertension, coronary artery disease, stress induced cardiomyopathy, stroke, smoker EXAM: CT HEAD WITHOUT CONTRAST TECHNIQUE: Contiguous axial images were obtained from the base of the skull through the vertex without intravenous contrast. COMPARISON:  09/03/2013 FINDINGS: Normal ventricular morphology. No midline shift or mass effect. Prominent perivascular spaces at basal ganglia stable. Otherwise normal appearance of brain parenchyma. No intracranial hemorrhage, mass lesion or evidence acute infarction. No extra-axial fluid collections. Mucosal retention cyst at floor of LEFT maxillary sinus. Bones and sinuses otherwise unremarkable. IMPRESSION: No acute intracranial abnormalities. Mucosal retention cysts LEFT maxillary sinus. Electronically Signed   By: Ulyses Southward M.D.   On: 11/26/2015 12:14     Management plans discussed with the patient, family and they are in agreement.  CODE STATUS:     Code Status Orders        Start     Ordered   11/26/15 1317  Full code   Continuous     11/26/15 1317    Code Status History    Date Active Date Inactive Code Status Order ID Comments User Context   05/15/2015  3:30 PM 05/16/2015  2:04 PM Full Code [id number]  Milagros Loll, MD ED      TOTAL TIME TAKING CARE OF THIS PATIENT: 35 minutes.    Alford Highland M.D on 11/27/2015 at 3:16 PM  Between 7am to 6pm - Pager - [phone]  After 6pm go  to [email or web address] - password [email or web address] [facility]  [place]  [facility]  Office  [phone]  CC: Primary care physician; [name], MD

## 2015-11-27 NOTE — Clinical Social Work Note (Signed)
Clinical Social Work Assessment  Patient Details  Name: [name] MRN: [id number] Date of Birth: [birth year]  Date of referral:  11/27/15               Reason for consult:  Discharge Planning                Permission sought to share information with:  Family Supports Permission granted to share information::  Yes, Verbal Permission Granted  Name::        Agency::     Relationship::   Roda Shutters (Daughter) [phone])  Contact Information:     Housing/Transportation Living arrangements for the past 2 months:  Single Family Home Source of Information:  Patient Patient Interpreter Needed:  None Criminal Activity/Legal Involvement Pertinent to Current Situation/Hospitalization:  No - Comment as needed Significant Relationships:  Adult Children, Siblings, Other Family Members, Dependent Children Lives with:  Minor Children Do you feel safe going back to the place where you live?  Yes Need for family participation in patient care:  Yes (Comment)  Care giving concerns:  Patient is concerned about her 71 year old daughter and 16 year old son.    Social Worker assessment / plan:  CSW was informed by RN at progression that patient's 10 year old daughter and 5 year old son were home alone yesterday night. CSW met with patient at bedside. Patient was laying in bed. Patient is alert and oriented. Per patient she has a 20 year old daughter and 38 year old son at home. She reports that they stayed home alone last night but she "constalty checked on them". Per patient "My 53 year old is really responsible and got them to school with no problem today". She reports "I don't like leaving them home alone and I'm ready to go". CSW inquired about family and frineds in the area that patient's children can stay with if she isn't discharged today. Per patient her sister can watch her children tonight and "it has already been arranged". She reports "The MD said I could possibly go home this evening...  That's what I'm hoping becasuse I start a new job on Friday and I would like to get some rest before then". CSW informed patient that she'd check back in this afternoon to see the safety plan for her children if patient isn't discharged. CSW also informed patient that if a safety plan isn't in place then [facility] would be contacted. Patient reports that she understands. Patient stated "I can't get mad at you.Gwenlyn Saran just trying to protect my children."   CSW will continue to follow and assist.   Employment status:  [employer] information:  Medicaid In [place] PT Recommendations:  Not assessed at this time Information / Referral to community resources:     Patient/Family's Response to care:  Patient is in agreement to establish a safety plan for her children where an adult will watch them tonight if she isn't discharged. She also understands CSW will make a [place] CSP report if a safety plan isn't created tonight.   Patient/Family's Understanding of and Emotional Response to Diagnosis, Current Treatment, and Prognosis:  Patient was really pleasant. She understands CSW's role and appreciates her assistance. She reports that she knows that CSW is "trying to protect my children".   Emotional Assessment Appearance:  Appears stated age Attitude/Demeanor/Rapport:   (None ) Affect (typically observed):  Accepting, Calm, Pleasant Orientation:  Oriented to Self, Oriented to Place, Oriented to  Time, Oriented  to Situation Alcohol / Substance use:  Not Applicable Psych involvement (Current and /or in the community):  No (Comment)  Discharge Needs  Concerns to be addressed:  Other (Comment Required (Minor children at home alone last night. ) Readmission within the last 30 days:  No Current discharge risk:  None Barriers to Discharge:  No Barriers Identified   [place], LCSW 11/27/2015, 11:56 AM

## 2015-11-27 NOTE — Plan of Care (Signed)
Problem: Tissue Perfusion: Goal: Risk factors for ineffective tissue perfusion will decrease Outcome: Progressing SQ Heparin     

## 2015-11-27 NOTE — Progress Notes (Signed)
Hydralazine  IV given for BP 167/101, will monitor

## 2015-11-27 NOTE — Progress Notes (Signed)
Pt's potassium came back at 2.7. MD Dr. Tobi Bastos notified. Orders given for 4 IV runs of of potassium. RN will administer as soon as they arrive from pharmacy. Syliva Overman, RN

## 2015-11-27 NOTE — Progress Notes (Signed)
BP 165/94, will give am BP meds and continue to monitor

## 2015-12-26 ENCOUNTER — Ambulatory Visit (INDEPENDENT_AMBULATORY_CARE_PROVIDER_SITE_OTHER): Payer: Medicaid Other | Admitting: Nurse Practitioner

## 2015-12-26 ENCOUNTER — Other Ambulatory Visit
Admission: RE | Admit: 2015-12-26 | Discharge: 2015-12-26 | Disposition: A | Discharge status: Home / Self Care | Payer: Medicaid Other | Source: Ambulatory Visit | Attending: Nurse Practitioner | Admitting: Nurse Practitioner

## 2015-12-26 ENCOUNTER — Encounter: Payer: Self-pay | Admitting: Nurse Practitioner

## 2015-12-26 VITALS — BP 150/96 | HR 96 | Resp 16 | Ht 67.0 in | Wt 242.8 lb

## 2015-12-26 DIAGNOSIS — I110 Hypertensive heart disease with heart failure: Secondary | ICD-10-CM | POA: Insufficient documentation

## 2015-12-26 DIAGNOSIS — I5032 Chronic diastolic (congestive) heart failure: Secondary | ICD-10-CM

## 2015-12-26 DIAGNOSIS — E876 Hypokalemia: Secondary | ICD-10-CM | POA: Diagnosis not present

## 2015-12-26 DIAGNOSIS — R61 Generalized hyperhidrosis: Secondary | ICD-10-CM

## 2015-12-26 DIAGNOSIS — Z91199 Patient's noncompliance with other medical treatment and regimen due to unspecified reason: Secondary | ICD-10-CM

## 2015-12-26 DIAGNOSIS — Z9119 Patient's noncompliance with other medical treatment and regimen: Secondary | ICD-10-CM

## 2015-12-26 DIAGNOSIS — Z8679 Personal history of other diseases of the circulatory system: Secondary | ICD-10-CM

## 2015-12-26 LAB — BASIC METABOLIC PANEL
Anion gap: 7 (ref 5–15)
BUN: 19 mg/dL (ref 6–20)
CALCIUM: 9.4 mg/dL (ref 8.9–10.3)
CO2: 27 mmol/L (ref 22–32)
CREATININE: 0.74 mg/dL (ref 0.44–1.00)
Chloride: 106 mmol/L (ref 101–111)
Glucose, Bld: 93 mg/dL (ref 65–99)
Potassium: 4.5 mmol/L (ref 3.5–5.1)
SODIUM: 140 mmol/L (ref 135–145)

## 2015-12-26 LAB — TSH: TSH: 2.564 u[IU]/mL (ref 0.350–4.500)

## 2015-12-26 MED ORDER — CARVEDILOL 12.5 MG PO TABS: ORAL_TABLET | ORAL | Status: DC

## 2015-12-26 MED ORDER — AMLODIPINE BESYLATE 10 MG PO TABS: 10.0000 mg | ORAL_TABLET | Freq: Every day | ORAL | Status: DC

## 2015-12-26 MED ORDER — LISINOPRIL 10 MG PO TABS: 10.0000 mg | ORAL_TABLET | Freq: Two times a day (BID) | ORAL | Status: DC

## 2015-12-26 MED ORDER — SIMVASTATIN 20 MG PO TABS: 20.0000 mg | ORAL_TABLET | Freq: Every day | ORAL | Status: AC

## 2015-12-26 NOTE — Patient Instructions (Signed)
Medication Instructions:  Your physician recommends that you continue on your current medications as directed. Please refer to the Current Medication list given to you today.   Labwork: BMET, TSH, 24 hour urine, urine potassium, renin, aldosterone +renin  Testing/Procedures: none  Follow-Up: Your physician recommends that you schedule a follow-up appointment in: 3 months with Dr. Kirke CorinArida.     Any Other Special Instructions Will Be Listed Below (If Applicable).     If you need a refill on your cardiac medications before your next appointment, please call your pharmacy.

## 2015-12-26 NOTE — Progress Notes (Signed)
Office Visit    Patient Name: [name] Date of Encounter: 12/26/2015  Primary Care Provider:  Evelene CroonNIEMEYER, MEINDERT, MD Primary Cardiologist:  Judie PetitM. Kirke CorinArida, MD   Chief Complaint    55 year old female with a history of stress-induced cardiomyopathy who presents for follow-up after recent hospitalization.  Past Medical History    Past Medical History  Diagnosis Date  . Dyslipidemia   . History of hyperkalemia   . Insomnia   . Malignant hypertension     a. multiple admissions.  . Polycythemia     a. chronic; due to smoking  . Anxiety and depression   . CVA (cerebrovascular accident) (HCC) 03/2012  . Non-obstructive CAD     a. 09/2012 NSTEMI/Cath: LM nl, LAD 3616m, 20d, LCX  20p/m, RCA 50p, 7741m, EF 35%;  b. 10/2012 NSTEMI in setting of anxiety, n/v, htn emergency, EF 55% by echo.  . Stress-induced cardiomyopathy     a. 09/2012 EF 35%;  b. 10/2012 Echo: EF >55%, mod conc LVH, nl RV.  Marland Kitchen. Hyperlipidemia   . Agoraphobia   . Hypokalemia     a. 10/2012 hospitalization  . Thrombocytopenia (HCC)     a. 10/2012 hospitalization  . Obesity     a. previously over 400 lbs.  . Tobacco abuse     a. ongoing - prev smoking 4ppd.   Past Surgical History  Procedure Laterality Date  . Cardiac catheterization  09/2012    [facility]    Allergies  No Known Allergies  History of Present Illness    55 year old female with the above complex past medical history. She has a long history of hypertension and anxiety and in December 2013 was admitted to the hospital with nausea, vomiting, and marked hypertension. She was found to have an EF of 35% and subsequently and with diagnostic catheterization revealing nonobstructive CAD. This was felt to represent a stress-induced myopathy and she was placed on beta blocker and ACE inhibitor therapy. She was readmitted a few weeks later with recurrent nausea, vomiting, anxiety, and hypertension.  Follow-up echocardiogram showed normalization of LV function. She was lost  to follow-up but was recently readmitted in February secondary to nausea and vomiting, which was felt to be viral gastroenteritis. In that setting, she was not able to keep her medicines down, though was not clear if she was ever taking them. She was markedly hypertensive and seen by our team with recommendation for resumption of previous home medications. She did have mild troponin elevation which was felt to be secondary to accelerated hypertension. It was not felt that she required additional ischemic evaluation. With resumption of previous medications, blood pressure became reasonably controlled and she was subsequently discharged. Of note, she was hypokalemic throughout admission.  Since her discharge, she has not been particularly compliant with her medications. She says she is not exactly sure what she supposed to be taking, though she was provided a list of discharge. She says she did not resume carvedilol even though it was prescribed at discharge. She also says she's been out of lisinopril for a day even though there are still a few tablets in the bottle. She was prescribed potassium but has not been taking it. She says she has been taking amlodipine. She needs refills on everything that was prescribed at discharge. Her biggest complaint today is of frequent episodes of diaphoresis that occur suddenly and have been happening for several years. There are no associated symptoms. She has not been having chest pain or dyspnea and  further denies PND, orthopnea, dizziness, syncope, edema, or early satiety. She is fairly anxious today.  Home Medications    Prior to Admission medications   Medication Sig Start Date End Date Taking? Authorizing Provider  ALPRAZolam Prudy Feeler) 1 MG tablet Take 1 mg by mouth 4 (four) times daily.   Yes Historical Provider, MD  amLODipine (NORVASC) 10 MG tablet Take 1 tablet (10 mg total) by mouth daily. 12/26/15  Yes Ok Anis, NP  atomoxetine (STRATTERA) 80 MG capsule  Take 80 mg by mouth daily.   Yes Historical Provider, MD  carvedilol (COREG) 12.5 MG tablet TAKE 1 TABLET BY MOUTH TWICE DAILY WITH A MEAL 12/26/15  Yes Ok Anis, NP  cyclobenzaprine (FLEXERIL) 10 MG tablet Take 10 mg by mouth 3 (three) times daily as needed.   Yes Historical Provider, MD  gabapentin (NEURONTIN) 300 MG capsule Take 300 mg by mouth 3 (three) times daily.   Yes Historical Provider, MD  lisinopril (PRINIVIL,ZESTRIL) 10 MG tablet Take 1 tablet (10 mg total) by mouth 2 (two) times daily. 12/26/15  Yes Ok Anis, NP  pantoprazole (PROTONIX) 40 MG tablet Take 40 mg by mouth daily.   Yes Historical Provider, MD  QUEtiapine (SEROQUEL) 200 MG tablet Take 200 mg by mouth at bedtime.   Yes Historical Provider, MD  sertraline (ZOLOFT) 100 MG tablet Take 100 mg by mouth daily.   Yes Historical Provider, MD  simvastatin (ZOCOR) 20 MG tablet Take 1 tablet (20 mg total) by mouth at bedtime. 12/26/15  Yes Ok Anis, NP  traMADol (ULTRAM) 50 MG tablet Take 50 mg by mouth 2 (two) times daily.   Yes Historical Provider, MD    Review of Systems    Patient's chronic anxiety. Her biggest complaint today is that of frequent diaphoresis spells which she feels are lifestyle limiting.  She denies PND, orthopnea, dizziness, syncope, edema, or early satiety. She continues to smoke a pack of cigarettes per day which is down from 4 to half packs per day. All other systems reviewed and are otherwise negative except as noted above.  Physical Exam    VS:  BP 150/96 mmHg  Pulse 96  Resp 16  Ht  (1.702 m)  Wt 242 lb 12.8 oz (110.133 kg)  BMI 38.02 kg/m2  SpO2 96% , BMI Body mass index is 38.02 kg/(m^2). GEN: Well nourished, well developed, in no acute distress. HEENT: normal. Neck: Supple, no JVD, carotid bruits, or masses. Cardiac: RRR, no murmurs, rubs, or gallops. No clubbing, cyanosis, edema.  Radials/DP/PT 2+ and equal bilaterally.  Respiratory:  Respirations regular and  unlabored, clear to auscultation bilaterally. GI: Soft, nontender, nondistended, BS + x 4. MS: no deformity or atrophy. Skin: warm and dry, no rash. Neuro:  Strength and sensation are intact. Psych: Normal affect.  Accessory Clinical Findings    Basic metabolic panel, TSH, plasma renin activity/plasma renin concentration with ratio pending. 24-hour urine for fractionated metanephrines and catecholamines pending  Assessment & Plan    1.  Hypertensive heart disease: Patient has a history of malignant hypertension due at least in part to noncompliance. She has also been admitted in the past, including in February, with nausea and vomiting symptoms resulting in an inability to keep her medicines down. Since her discharge, she says she is not sure what she has been taking. She was prescribed amlodipine, carvedilol, and lisinopril for her hypertension, and she says she does not think she is taking carvedilol and believe she is  out of lisinopril. I stressed the importance of compliance with her medications. Her blood pressure is 150/96 today. We have provided her with new prescriptions for amlodipine 10 mg daily, carvedilol 12.5 mg twice a day, lisinopril 10 mg twice a day. As she has a history of significant and seemingly sudden hypertension along with reports of frequent, sudden onset of diaphoresis, I will check a 24-hour urine for metanephrines and catecholamines to rule out pheochromocytoma.  2. History of stress-induced cardiomyopathy: With subsequent normalization of LV function in January 2014. She remains on beta blocker and ACE inhibitor therapy, and as above, I have stressed the importance of compliance. She is euvolemic on exam today and has not had any complaints of dyspnea, orthopnea, or edema.  3. Hypokalemia: Review of labs over the years reveals a history of hypokalemia dating back at least to June 2013. In the setting of markedly hypertension and hypokalemia, I will check a basic  metabolic panel, plasma renin activity, concentration, and ratio to rule out hyperaldosteronism. I will also check a 24-hour urine potassium. I have advised that she take the potassium that she was discharged on-10 mg daily. This may require adjustment pending lab work today.  4. Diaphoretic spells: Patient says she has frequent, sudden episodes of diaphoresis. These are embarrassing for her and lifestyle limiting. She says her last menses was 5 years ago and at that time, she was not expressing the symptoms. As above, in the setting of a history of markedly hypertension, I will check a 24-hour urine for fractionated metanephrines and catecholamines to rule out pheochromocytoma. I will also check a TSH as she tells me she has a history of thyroid nodule which was previously evaluated over one year ago by her primary care provider. If testing returns normal,, I would recommend follow-up with primary care potentially psychiatry as some of her medications may cause hot flashes (Strattera).  5. Anxiety:  She is on Xanax 4 times a day and this was prescribed by primary care. She also takes Zoloft, Strattera, and Seroquel.   6. Ongoing tobacco abuse: Complete cessation advice. She has no plans to quit.  7. Disposition: Testing as above. Follow-up with Dr. Kirke Corin in 3 mos or sooner if necessary.  Nicolasa Ducking, NP 12/26/2015, 3:47 PM

## 2016-01-01 LAB — ALDOSTERONE + RENIN ACTIVITY W/ RATIO
ALDO / PRA RATIO: 0.6 (ref 0.0–30.0)
ALDOSTERONE: 4.9 ng/dL (ref 0.0–30.0)
PRA LC/MS/MS: 7.99 ng/mL/h — AB (ref 0.167–5.380)

## 2016-01-07 ENCOUNTER — Other Ambulatory Visit: Payer: Self-pay

## 2016-01-07 MED ORDER — CARVEDILOL 12.5 MG PO TABS: ORAL_TABLET | ORAL | Status: AC

## 2016-01-07 MED ORDER — LISINOPRIL 10 MG PO TABS: 10.0000 mg | ORAL_TABLET | Freq: Two times a day (BID) | ORAL | Status: AC

## 2016-03-30 ENCOUNTER — Ambulatory Visit: Payer: Medicaid Other | Admitting: Cardiovascular Disease

## 2016-03-30 ENCOUNTER — Encounter: Payer: Self-pay | Admitting: *Deleted

## 2016-05-09 ENCOUNTER — Other Ambulatory Visit: Payer: Self-pay | Admitting: Nurse Practitioner

## 2016-06-08 ENCOUNTER — Telehealth: Payer: Self-pay | Admitting: Cardiovascular Disease

## 2016-06-08 NOTE — Telephone Encounter (Signed)
Received records request from Disability Determination services , forwarded to [employer] for processing. ° °

## 2016-06-22 ENCOUNTER — Encounter: Payer: Self-pay | Admitting: Emergency Medicine

## 2016-06-22 ENCOUNTER — Emergency Department: Payer: Medicaid Other

## 2016-06-22 ENCOUNTER — Emergency Department
Admission: EM | Admit: 2016-06-22 | Discharge: 2016-06-22 | Disposition: A | Discharge status: Home / Self Care | Payer: Medicaid Other | Attending: Emergency Medicine | Admitting: Emergency Medicine

## 2016-06-22 DIAGNOSIS — R1115 Cyclical vomiting syndrome unrelated to migraine: Secondary | ICD-10-CM

## 2016-06-22 DIAGNOSIS — F1290 Cannabis use, unspecified, uncomplicated: Secondary | ICD-10-CM | POA: Insufficient documentation

## 2016-06-22 DIAGNOSIS — F17210 Nicotine dependence, cigarettes, uncomplicated: Secondary | ICD-10-CM | POA: Diagnosis not present

## 2016-06-22 DIAGNOSIS — I10 Essential (primary) hypertension: Secondary | ICD-10-CM | POA: Diagnosis not present

## 2016-06-22 DIAGNOSIS — R112 Nausea with vomiting, unspecified: Secondary | ICD-10-CM | POA: Insufficient documentation

## 2016-06-22 DIAGNOSIS — I2510 Atherosclerotic heart disease of native coronary artery without angina pectoris: Secondary | ICD-10-CM | POA: Diagnosis not present

## 2016-06-22 DIAGNOSIS — R1084 Generalized abdominal pain: Secondary | ICD-10-CM | POA: Diagnosis present

## 2016-06-22 DIAGNOSIS — Z79899 Other long term (current) drug therapy: Secondary | ICD-10-CM | POA: Insufficient documentation

## 2016-06-22 DIAGNOSIS — R51 Headache: Secondary | ICD-10-CM

## 2016-06-22 DIAGNOSIS — E860 Dehydration: Secondary | ICD-10-CM

## 2016-06-22 DIAGNOSIS — R519 Headache, unspecified: Secondary | ICD-10-CM

## 2016-06-22 LAB — COMPREHENSIVE METABOLIC PANEL
ALBUMIN: 5.2 g/dL — AB (ref 3.5–5.0)
ALT: 19 U/L (ref 14–54)
AST: 26 U/L (ref 15–41)
Alkaline Phosphatase: 69 U/L (ref 38–126)
Anion gap: 13 (ref 5–15)
BILIRUBIN TOTAL: 1 mg/dL (ref 0.3–1.2)
BUN: 19 mg/dL (ref 6–20)
CHLORIDE: 104 mmol/L (ref 101–111)
CO2: 22 mmol/L (ref 22–32)
CREATININE: 0.73 mg/dL (ref 0.44–1.00)
Calcium: 9.9 mg/dL (ref 8.9–10.3)
GFR calc Af Amer: >60 mL/min (ref >60)
GLUCOSE: 131 mg/dL — AB (ref 65–99)
POTASSIUM: 3.6 mmol/L (ref 3.5–5.1)
Sodium: 139 mmol/L (ref 135–145)
Total Protein: 9.2 g/dL — ABNORMAL HIGH (ref 6.5–8.1)

## 2016-06-22 LAB — CBC
HEMATOCRIT: 56.2 % — AB (ref 35.0–47.0)
Hemoglobin: 19.8 g/dL — ABNORMAL HIGH (ref 12.0–16.0)
MCH: 32.9 pg (ref 26.0–34.0)
MCHC: 35.2 g/dL (ref 32.0–36.0)
MCV: 93.4 fL (ref 80.0–100.0)
PLATELETS: 216 10*3/uL (ref 150–440)
RBC: 6.01 MIL/uL — ABNORMAL HIGH (ref 3.80–5.20)
RDW: 14.3 % (ref 11.5–14.5)
WBC: 13.3 10*3/uL — ABNORMAL HIGH (ref 3.6–11.0)

## 2016-06-22 LAB — URINALYSIS COMPLETE WITH MICROSCOPIC (ARMC ONLY)
BACTERIA UA: NONE SEEN
BILIRUBIN URINE: NEGATIVE
GLUCOSE, UA: NEGATIVE mg/dL
Leukocytes, UA: NEGATIVE
Nitrite: NEGATIVE
Protein, ur: >500 mg/dL — AB
Specific Gravity, Urine: 1.027 (ref 1.005–1.030)
pH: 5 (ref 5.0–8.0)

## 2016-06-22 LAB — TROPONIN I

## 2016-06-22 LAB — LIPASE, BLOOD: LIPASE: 30 U/L (ref 11–51)

## 2016-06-22 MED ORDER — LORAZEPAM 2 MG/ML IJ SOLN
1.0000 mg | Freq: Once | INTRAMUSCULAR | Status: AC
  Administered 2016-06-22: 1 mg via INTRAVENOUS
  Filled 2016-06-22: qty 1

## 2016-06-22 MED ORDER — ONDANSETRON HCL 4 MG/2ML IJ SOLN
4.0000 mg | Freq: Once | INTRAMUSCULAR | Status: AC | PRN
  Administered 2016-06-22: 4 mg via INTRAVENOUS
  Filled 2016-06-22: qty 2

## 2016-06-22 MED ORDER — BUTALBITAL-APAP-CAFFEINE 50-325-40 MG PO TABS
2.0000 | ORAL_TABLET | Freq: Once | ORAL | Status: AC
  Administered 2016-06-22: 2 via ORAL
  Filled 2016-06-22: qty 2

## 2016-06-22 MED ORDER — METOCLOPRAMIDE HCL 10 MG PO TABS: 10.0000 mg | ORAL_TABLET | Freq: Three times a day (TID) | ORAL | 0 refills | Status: AC | PRN

## 2016-06-22 MED ORDER — HYDROXYZINE HCL 10 MG PO TABS: 10.0000 mg | ORAL_TABLET | Freq: Three times a day (TID) | ORAL | 0 refills | Status: AC | PRN

## 2016-06-22 MED ORDER — METOCLOPRAMIDE HCL 5 MG/ML IJ SOLN
10.0000 mg | Freq: Once | INTRAMUSCULAR | Status: AC
  Administered 2016-06-22: 10 mg via INTRAVENOUS
  Filled 2016-06-22: qty 2

## 2016-06-22 MED ORDER — HALOPERIDOL LACTATE 5 MG/ML IJ SOLN
2.5000 mg | Freq: Once | INTRAMUSCULAR | Status: AC
  Administered 2016-06-22: 2.5 mg via INTRAVENOUS
  Filled 2016-06-22: qty 1

## 2016-06-22 MED ORDER — BUTALBITAL-APAP-CAFFEINE 50-325-40 MG PO TABS: 1.0000 | ORAL_TABLET | Freq: Four times a day (QID) | ORAL | 0 refills | Status: AC | PRN

## 2016-06-22 MED ORDER — KETOROLAC TROMETHAMINE 30 MG/ML IJ SOLN
30.0000 mg | Freq: Once | INTRAMUSCULAR | Status: AC
  Administered 2016-06-22: 30 mg via INTRAVENOUS
  Filled 2016-06-22: qty 1

## 2016-06-22 NOTE — ED Provider Notes (Signed)
[facility] Emergency Department Provider Note   ____________________________________________   First MD Initiated Contact with Patient 06/22/16 [phone]     (approximate)  I have reviewed the triage vital signs and the nursing notes.   HISTORY  Chief Complaint Emesis    HPI [name] is a 55 y.o. female who comes into the hospital today with vomiting and abdominal pain. The patient reports that she has extreme [place] phobia. She reports that it causes her to sometimes have vomiting. She reports that she's been here numerous times for these symptoms. She reports that the symptoms started 3 days ago. She reports that her head hurts and she can't keep any of her medications down. The patient reports that the anxiety that she has caused her to vomit. She started a new job and reports that they would not let her drink. She reports that she started sweating and then she thinks the symptoms started from there. She has some abdominal pain from the dry heaves. The abdominal pain is diffuse without focality. She reports that she has some chest pain and shortness of breath as well as some abdominal pain. Her emesis is clear and brownish in color but she's had no diarrhea.The patient rates her pain a 9 out of 10 in intensity. She is here for treatment and evaluation of her symptoms. She reports that she typically gets morphine, Zofran and Ativan for these symptoms.   Past Medical History:  Diagnosis Date  . Agoraphobia   . Anxiety and depression   . CVA (cerebrovascular accident) (HCC) 03/2012  . Dyslipidemia   . History of hyperkalemia   . Hyperlipidemia   . Hypokalemia    a. 10/2012 hospitalization  . Insomnia   . Malignant hypertension    a. multiple admissions.  . Non-obstructive CAD    a. 09/2012 NSTEMI/Cath: LM nl, LAD 421m, 20d, LCX  20p/m, RCA 50p, 2227m, EF 35%;  b. 10/2012 NSTEMI in setting of anxiety, n/v, htn emergency, EF 55% by echo.  . Obesity    a.  previously over 400 lbs.  . Polycythemia    a. chronic; due to smoking  . Stress-induced cardiomyopathy    a. 09/2012 EF 35%;  b. 10/2012 Echo: EF >55%, mod conc LVH, nl RV.  Marland Kitchen. Thrombocytopenia (HCC)    a. 10/2012 hospitalization  . Tobacco abuse    a. ongoing - prev smoking 4ppd.    Patient Active Problem List   Diagnosis Date Noted  . Hypertensive urgency 11/26/2015  . Hyponatremia 05/15/2015  . Leg pain 05/12/2013  . Tobacco abuse   . Obesity   . Thrombocytopenia (HCC)   . Hypokalemia   . Agoraphobia   . Hyperlipidemia   . Stress-induced cardiomyopathy   . Coronary artery disease   . Anxiety and depression   . Malignant hypertension   . Dyslipidemia     Past Surgical History:  Procedure Laterality Date  . CARDIAC CATHETERIZATION  09/2012   [facility]    Prior to Admission medications   Medication Sig Start Date End Date Taking? Authorizing Provider  ALPRAZolam Prudy Feeler(XANAX) 1 MG tablet Take 1 mg by mouth 4 (four) times daily.    Historical Provider, MD  amLODipine (NORVASC) 10 MG tablet TAKE 1 TABLET (10 MG TOTAL) BY MOUTH DAILY. 05/11/16   Iran OuchMuhammad A Arida, MD  atomoxetine (STRATTERA) 80 MG capsule Take 80 mg by mouth daily.    Historical Provider, MD  butalbital-acetaminophen-caffeine (FIORICET) 50-325-40 MG tablet Take 1-2 tablets by mouth  every 6 (six) hours as needed for headache. 06/22/16 06/22/17  Rebecka Apley, MD  carvedilol (COREG) 12.5 MG tablet TAKE 1 TABLET BY MOUTH TWICE DAILY WITH A MEAL 01/07/16   Ok Anis, NP  cyclobenzaprine (FLEXERIL) 10 MG tablet Take 10 mg by mouth 3 (three) times daily as needed.    Historical Provider, MD  gabapentin (NEURONTIN) 300 MG capsule Take 300 mg by mouth 3 (three) times daily.    Historical Provider, MD  hydrOXYzine (ATARAX/VISTARIL) 10 MG tablet Take 1 tablet (10 mg total) by mouth 3 (three) times daily as needed. 06/22/16   Rebecka Apley, MD  lisinopril (PRINIVIL,ZESTRIL) 10 MG tablet Take 1 tablet (10 mg total) by mouth  2 (two) times daily. 01/07/16   Ok Anis, NP  metoCLOPramide (REGLAN) 10 MG tablet Take 1 tablet (10 mg total) by mouth every 8 (eight) hours as needed. 06/22/16   Rebecka Apley, MD  pantoprazole (PROTONIX) 40 MG tablet Take 40 mg by mouth daily.    Historical Provider, MD  QUEtiapine (SEROQUEL) 200 MG tablet Take 200 mg by mouth at bedtime.    Historical Provider, MD  sertraline (ZOLOFT) 100 MG tablet Take 100 mg by mouth daily.    Historical Provider, MD  simvastatin (ZOCOR) 20 MG tablet Take 1 tablet (20 mg total) by mouth at bedtime. 12/26/15   Ok Anis, NP  traMADol (ULTRAM) 50 MG tablet Take 50 mg by mouth 2 (two) times daily.    Historical Provider, MD    Allergies Review of patient's allergies indicates no known allergies.  Family History  Problem Relation Age of Onset  . Family history unknown: Yes    Social History Social History  Substance Use Topics  . Smoking status: Current Every Day Smoker    Packs/day: 1.00    Years: 30.00    Types: Cigarettes  . Smokeless tobacco: Never Used  . Alcohol use No    Review of Systems Constitutional: No fever/chills Eyes: No visual changes. ENT: No sore throat. Cardiovascular: Denies chest pain. Respiratory: Denies shortness of breath. Gastrointestinal:  abdominal pain.   nausea, vomiting.  No diarrhea.  No constipation. Genitourinary: Negative for dysuria. Musculoskeletal: Negative for back pain. Skin: Negative for rash. Neurological: Headache  10-point ROS otherwise negative.  ____________________________________________   PHYSICAL EXAM:  VITAL SIGNS: ED Triage Vitals  Enc Vitals Group     BP 06/22/16 0144 (!) 151/113     Pulse Rate 06/22/16 0144 (!) 107     Resp 06/22/16 0144 18     Temp 06/22/16 0144 99.1 F (37.3 C)     Temp Source 06/22/16 0144 Oral     SpO2 06/22/16 0144 100 %     Weight 06/22/16 0146 240 lb (108.9 kg)     Height 06/22/16 0146 5\' 7"  (1.702 m)     Head Circumference --       Peak Flow --      Pain Score 06/22/16 0146 9     Pain Loc --      Pain Edu? --      Excl. in GC? --     Constitutional: Alert and oriented. Emotional appearing and in Moderate distress. Eyes: Conjunctivae are normal. PERRL. EOMI. Head: Atraumatic. Nose: No congestion/rhinnorhea. Mouth/Throat: Mucous membranes are moist.  Oropharynx non-erythematous. Cardiovascular: Normal rate, regular rhythm. Grossly normal heart sounds.  Good peripheral circulation. Respiratory: Normal respiratory effort.  No retractions. Lungs CTAB. Gastrointestinal: Soft Diffuse tenderness to palpation. No distention.  Positive bowel sounds Musculoskeletal: No lower extremity tenderness nor edema.  No joint effusions. Neurologic:  Normal speech and language.  Skin:  Skin is warm, dry and intact.  Psychiatric: Mood and affect are normal.   ____________________________________________   LABS (all labs ordered are listed, but only abnormal results are displayed)  Labs Reviewed  COMPREHENSIVE METABOLIC PANEL - Abnormal; Notable for the following:       Result Value   Glucose, Bld 131 (*)    Total Protein 9.2 (*)    Albumin 5.2 (*)    All other components within normal limits  CBC - Abnormal; Notable for the following:    WBC 13.3 (*)    RBC 6.01 (*)    Hemoglobin 19.8 (*)    HCT 56.2 (*)    All other components within normal limits  URINALYSIS COMPLETEWITH MICROSCOPIC ([facility] ONLY) - Abnormal; Notable for the following:    Color, Urine YELLOW (*)    APPearance CLEAR (*)    Ketones, ur 2+ (*)    Hgb urine dipstick 2+ (*)    Protein, ur >500 (*)    Squamous Epithelial / LPF 0-5 (*)    All other components within normal limits  LIPASE, BLOOD  TROPONIN I   ____________________________________________  EKG  ED ECG REPORT I, Rebecka Apley, the attending physician, personally viewed and interpreted this ECG.   Date: 06/22/2016  EKG Time: 544  Rate: 92  Rhythm: normal sinus rhythm  Axis:  normal  Intervals:none  ST&T Change: none  ____________________________________________  RADIOLOGY  None ____________________________________________   PROCEDURES  Procedure(s) performed: None  Procedures  Critical Care performed: No  ____________________________________________   INITIAL IMPRESSION / ASSESSMENT AND PLAN / ED COURSE  Pertinent labs & imaging results that were available during my care of the patient were reviewed by me and considered in my medical decision making (see chart for details).  This is a 55 year old female who comes into the hospital today with abdominal pain and vomiting which she reports is due to her anxiety. I will check some blood work and give the patient some normal saline as well as a dose of Reglan and the dose of Ativan. The patient will be reassessed.  Clinical Course  Value Comment By Time  DG Chest 2 View Vascular congestion and mild cardiomegaly. Bibasilar airspace opacities may reflect mild interstitial edema or possibly pneumonia   Rebecka Apley, MD 09/04 0505    The patient received a dose of Haldol 2.5 mg as well as fioricet and toradol. The patient feels improved after the medication. She was able to take her medications without any further vomiting. The patient will be discharged to home to follow up with her primary care provider.  ____________________________________________   FINAL CLINICAL IMPRESSION(S) / ED DIAGNOSES  Final diagnoses:  Dehydration  Non-intractable cyclical vomiting with nausea  Generalized abdominal pain  Acute nonintractable headache, unspecified headache type      NEW MEDICATIONS STARTED DURING THIS VISIT:  New Prescriptions   BUTALBITAL-ACETAMINOPHEN-CAFFEINE (FIORICET) 50-325-40 MG TABLET    Take 1-2 tablets by mouth every 6 (six) hours as needed for headache.   HYDROXYZINE (ATARAX/VISTARIL) 10 MG TABLET    Take 1 tablet (10 mg total) by mouth 3 (three) times daily as needed.    METOCLOPRAMIDE (REGLAN) 10 MG TABLET    Take 1 tablet (10 mg total) by mouth every 8 (eight) hours as needed.     Note:  This document was prepared using Dragon  voice recognition software and may include unintentional dictation errors.    Rebecka Apley, MD 06/22/16 [phone]

## 2016-06-22 NOTE — ED Triage Notes (Signed)
Pt c/o anxiety x3 years and usually takes Xanax for this. Pt states that she has been experiencing N/V x3 days. Pt states that she hasn't had anything to eat or drink for this time. Pt is alert and oriented with NAD noted at this time.

## 2016-06-22 NOTE — ED Notes (Signed)
She is calling for a ride home  Blue scrubs provided

## 2016-06-24 ENCOUNTER — Emergency Department
Admission: EM | Admit: 2016-06-24 | Discharge: 2016-06-25 | Disposition: A | Discharge status: Home / Self Care | Payer: Medicaid Other | Attending: Emergency Medicine | Admitting: Emergency Medicine

## 2016-06-24 DIAGNOSIS — G43A0 Cyclical vomiting, not intractable: Secondary | ICD-10-CM | POA: Insufficient documentation

## 2016-06-24 DIAGNOSIS — F17210 Nicotine dependence, cigarettes, uncomplicated: Secondary | ICD-10-CM | POA: Diagnosis not present

## 2016-06-24 DIAGNOSIS — I2510 Atherosclerotic heart disease of native coronary artery without angina pectoris: Secondary | ICD-10-CM | POA: Insufficient documentation

## 2016-06-24 DIAGNOSIS — Z8673 Personal history of transient ischemic attack (TIA), and cerebral infarction without residual deficits: Secondary | ICD-10-CM | POA: Insufficient documentation

## 2016-06-24 DIAGNOSIS — Z79899 Other long term (current) drug therapy: Secondary | ICD-10-CM | POA: Insufficient documentation

## 2016-06-24 DIAGNOSIS — I10 Essential (primary) hypertension: Secondary | ICD-10-CM | POA: Diagnosis not present

## 2016-06-24 DIAGNOSIS — R1115 Cyclical vomiting syndrome unrelated to migraine: Secondary | ICD-10-CM

## 2016-06-24 LAB — CBC
HEMATOCRIT: 55.1 % — AB (ref 35.0–47.0)
HEMOGLOBIN: 18.8 g/dL — AB (ref 12.0–16.0)
MCH: 32.6 pg (ref 26.0–34.0)
MCHC: 34.2 g/dL (ref 32.0–36.0)
MCV: 95.4 fL (ref 80.0–100.0)
Platelets: 196 10*3/uL (ref 150–440)
RBC: 5.78 MIL/uL — AB (ref 3.80–5.20)
RDW: 14.2 % (ref 11.5–14.5)
WBC: 13.2 10*3/uL — ABNORMAL HIGH (ref 3.6–11.0)

## 2016-06-24 LAB — COMPREHENSIVE METABOLIC PANEL
ALBUMIN: 4.6 g/dL (ref 3.5–5.0)
ALK PHOS: 58 U/L (ref 38–126)
ALT: 16 U/L (ref 14–54)
ANION GAP: 10 (ref 5–15)
AST: 21 U/L (ref 15–41)
BILIRUBIN TOTAL: 0.9 mg/dL (ref 0.3–1.2)
BUN: 17 mg/dL (ref 6–20)
CALCIUM: 9.5 mg/dL (ref 8.9–10.3)
CO2: 22 mmol/L (ref 22–32)
CREATININE: 0.77 mg/dL (ref 0.44–1.00)
Chloride: 107 mmol/L (ref 101–111)
GFR calc Af Amer: >60 mL/min (ref >60)
GFR calc non Af Amer: >60 mL/min (ref >60)
GLUCOSE: 125 mg/dL — AB (ref 65–99)
Potassium: 3.3 mmol/L — ABNORMAL LOW (ref 3.5–5.1)
SODIUM: 139 mmol/L (ref 135–145)
TOTAL PROTEIN: 8 g/dL (ref 6.5–8.1)

## 2016-06-24 LAB — LIPASE, BLOOD: Lipase: 43 U/L (ref 11–51)

## 2016-06-24 LAB — TROPONIN I: Troponin I: <0.03 ng/mL (ref <0.03)

## 2016-06-24 MED ORDER — PROMETHAZINE HCL 25 MG/ML IJ SOLN
12.5000 mg | Freq: Once | INTRAMUSCULAR | Status: AC
Start: 2016-06-25 — End: 2016-06-25
  Administered 2016-06-25: 12.5 mg via INTRAVENOUS
  Filled 2016-06-24: qty 1

## 2016-06-24 MED ORDER — KETOROLAC TROMETHAMINE 30 MG/ML IJ SOLN
30.0000 mg | Freq: Once | INTRAMUSCULAR | Status: AC
  Administered 2016-06-25: 30 mg via INTRAVENOUS
  Filled 2016-06-24: qty 1

## 2016-06-24 MED ORDER — ONDANSETRON 4 MG PO TBDP
8.0000 mg | ORAL_TABLET | Freq: Once | ORAL | Status: AC
  Administered 2016-06-24: 8 mg via ORAL

## 2016-06-24 MED ORDER — SODIUM CHLORIDE 0.9 % IV BOLUS (SEPSIS)
1000.0000 mL | Freq: Once | INTRAVENOUS | Status: AC
  Administered 2016-06-25: 1000 mL via INTRAVENOUS

## 2016-06-24 MED ORDER — ONDANSETRON 8 MG PO TBDP
ORAL_TABLET | ORAL | Status: AC
  Administered 2016-06-24: 8 mg via ORAL
  Filled 2016-06-24: qty 1

## 2016-06-24 NOTE — ED Triage Notes (Addendum)
Pt requesting ativan in triage and inducing vomiting in triage by putting her finger down her throat.

## 2016-06-24 NOTE — ED Notes (Addendum)
Dr. Manson PasseyBrown at bedside.   Pt states N&V. States she has had phenergan and ativan prescriptions but when she runs out she starts N&V again. States has been seen for this many times. Dx with agoraphobia at 27 yrs. States has an appt with PCP on Friday. States she has been taking anxiety meds but has been throwing them up so they haven't been getting into her system.   States daughter coming to pick her up.

## 2016-06-24 NOTE — ED Triage Notes (Signed)
Pt in with co vomiting for a week, small amt of diarrhea. Also co generalized abd pain and chest pain, was seen here 2 days ago for the same and dx with anxiety and vomiting. Dc home with meds without relief.

## 2016-06-25 ENCOUNTER — Emergency Department: Payer: Medicaid Other

## 2016-06-25 NOTE — ED Notes (Signed)
Blood pressure elevated at time of discharge.  Dr brown aware, per dr brown, still discharge pt.  Pt informed to take meds at home for blood pressure.  Pt states feeling better after meds in the ER tonight.   Iv d'ced.

## 2016-06-25 NOTE — ED Notes (Signed)
Dr. Manson PasseyBrown and RN in with pt.  Pt states "I'm feeling much better."  siderails up x 2.

## 2016-06-25 NOTE — ED Provider Notes (Signed)
[facility] Emergency Department Provider Note    First MD Initiated Contact with Patient 06/24/16 2339     (approximate)  I have reviewed the triage vital signs and the nursing notes.   HISTORY  Chief Complaint Emesis    HPI [name] is a 55 y.o. female with history of Agoraphobia anxiety depression returns to the emergency department with nausea and vomiting times one week, 10 out of 10 epigastric abdominal discomfort which she states occurs with her anxiety. Patient states that she has been unable to take her Xanax secondary to vomiting. Patient states "this happens every few months and she usually requires morphine and Phenergan and Ativan in the emergency department. Of note patient was seen in the emergency department on 06/22/2016 for the same. Patient denies any dyspnea   Past Medical History:  Diagnosis Date  . Agoraphobia   . Anxiety and depression   . CVA (cerebrovascular accident) (HCC) 03/2012  . Dyslipidemia   . History of hyperkalemia   . Hyperlipidemia   . Hypokalemia    a. 10/2012 hospitalization  . Insomnia   . Malignant hypertension    a. multiple admissions.  . Non-obstructive CAD    a. 09/2012 NSTEMI/Cath: LM nl, LAD 43m, 20d, LCX  20p/m, RCA 50p, 51m, EF 35%;  b. 10/2012 NSTEMI in setting of anxiety, n/v, htn emergency, EF 55% by echo.  . Obesity    a. previously over 400 lbs.  . Polycythemia    a. chronic; due to smoking  . Stress-induced cardiomyopathy    a. 09/2012 EF 35%;  b. 10/2012 Echo: EF >55%, mod conc LVH, nl RV.  Marland Kitchen Thrombocytopenia (HCC)    a. 10/2012 hospitalization  . Tobacco abuse    a. ongoing - prev smoking 4ppd.    Patient Active Problem List   Diagnosis Date Noted  . Hypertensive urgency 11/26/2015  . Hyponatremia 05/15/2015  . Leg pain 05/12/2013  . Tobacco abuse   . Obesity   . Thrombocytopenia (HCC)   . Hypokalemia   . Agoraphobia   . Hyperlipidemia   . Stress-induced cardiomyopathy   .  Coronary artery disease   . Anxiety and depression   . Malignant hypertension   . Dyslipidemia     Past Surgical History:  Procedure Laterality Date  . CARDIAC CATHETERIZATION  09/2012   [facility]    Prior to Admission medications   Medication Sig Start Date End Date Taking? Authorizing Provider  ALPRAZolam Prudy Feeler) 1 MG tablet Take 1 mg by mouth 4 (four) times daily.    Historical Provider, MD  amLODipine (NORVASC) 10 MG tablet TAKE 1 TABLET (10 MG TOTAL) BY MOUTH DAILY. 05/11/16   Iran Ouch, MD  atomoxetine (STRATTERA) 80 MG capsule Take 80 mg by mouth daily.    Historical Provider, MD  butalbital-acetaminophen-caffeine (FIORICET) [phone] MG tablet Take 1-2 tablets by mouth every 6 (six) hours as needed for headache. 06/22/16 06/22/17  Rebecka Apley, MD  carvedilol (COREG) 12.5 MG tablet TAKE 1 TABLET BY MOUTH TWICE DAILY WITH A MEAL 01/07/16   Ok Anis, NP  cyclobenzaprine (FLEXERIL) 10 MG tablet Take 10 mg by mouth 3 (three) times daily as needed.    Historical Provider, MD  gabapentin (NEURONTIN) 300 MG capsule Take 300 mg by mouth 3 (three) times daily.    Historical Provider, MD  hydrOXYzine (ATARAX/VISTARIL) 10 MG tablet Take 1 tablet (10 mg total) by mouth 3 (three) times daily as needed. 06/22/16   Revonda Standard  Catalina GravelP Webster, MD  lisinopril (PRINIVIL,ZESTRIL) 10 MG tablet Take 1 tablet (10 mg total) by mouth 2 (two) times daily. 01/07/16   Ok Anishristopher R Berge, NP  metoCLOPramide (REGLAN) 10 MG tablet Take 1 tablet (10 mg total) by mouth every 8 (eight) hours as needed. 06/22/16   Rebecka ApleyAllison P Webster, MD  pantoprazole (PROTONIX) 40 MG tablet Take 40 mg by mouth daily.    Historical Provider, MD  QUEtiapine (SEROQUEL) 200 MG tablet Take 200 mg by mouth at bedtime.    Historical Provider, MD  sertraline (ZOLOFT) 100 MG tablet Take 100 mg by mouth daily.    Historical Provider, MD  simvastatin (ZOCOR) 20 MG tablet Take 1 tablet (20 mg total) by mouth at bedtime. 12/26/15   Ok Anishristopher R  Berge, NP  traMADol (ULTRAM) 50 MG tablet Take 50 mg by mouth 2 (two) times daily.    Historical Provider, MD    Allergies No known drug allergies  Family History  Problem Relation Age of Onset  . Family history unknown: Yes    Social History Social History  Substance Use Topics  . Smoking status: Current Every Day Smoker    Packs/day: 1.00    Years: 30.00    Types: Cigarettes  . Smokeless tobacco: Never Used  . Alcohol use No    Review of Systems Constitutional: No fever/chills Eyes: No visual changes. ENT: No sore throat. Cardiovascular: Denies chest pain. Respiratory: Denies shortness of breath. Gastrointestinal: Positive for vomiting and nausea. Positive for upper abdominal pain Genitourinary: Negative for dysuria. Musculoskeletal: Negative for back pain. Skin: Negative for rash. Neurological: Negative for headaches, focal weakness or numbness.  10-point ROS otherwise negative.  ____________________________________________   PHYSICAL EXAM:  VITAL SIGNS: ED Triage Vitals  Enc Vitals Group     BP 06/24/16 2017 (!) 173/107     Pulse Rate 06/24/16 2017 (!) 103     Resp 06/24/16 2017 18     Temp 06/24/16 2017 99.8 F (37.7 C)     Temp Source 06/24/16 2017 Oral     SpO2 06/24/16 2017 100 %     Weight 06/24/16 2013 240 lb (108.9 kg)     Height 06/24/16 2013 5\' 7"  (1.702 m)     Head Circumference --      Peak Flow --      Pain Score 06/24/16 2018 10     Pain Loc --      Pain Edu? --      Excl. in GC? --     Constitutional: Alert and oriented. Well appearing and in no acute distress. Eyes: Conjunctivae are normal. PERRL. EOMI. Head: Atraumatic. Mouth/Throat: Mucous membranes are moist.  Oropharynx non-erythematous. Neck: No stridor.  No meningeal signs.  Cardiovascular: Normal rate, regular rhythm. Good peripheral circulation. Grossly normal heart sounds. Respiratory: Normal respiratory effort.  No retractions. Lungs CTAB. Gastrointestinal: Soft and  nontender. No distention.  Musculoskeletal: No lower extremity tenderness nor edema. No gross deformities of extremities. Neurologic:  Normal speech and language. No gross focal neurologic deficits are appreciated.  Skin:  Skin is warm, dry and intact. No rash noted. Psychiatric: Mood and affect are normal. Speech and behavior are normal.  ____________________________________________   LABS (all labs ordered are listed, but only abnormal results are displayed)  Labs Reviewed  CBC - Abnormal; Notable for the following:       Result Value   WBC 13.2 (*)    RBC 5.78 (*)    Hemoglobin 18.8 (*)  HCT 55.1 (*)    All other components within normal limits  COMPREHENSIVE METABOLIC PANEL - Abnormal; Notable for the following:    Potassium 3.3 (*)    Glucose, Bld 125 (*)    All other components within normal limits  TROPONIN I  LIPASE, BLOOD   ____________________________________________  EKG  ED ECG REPORT I, Diablo N Aniah Pauli, the attending physician, personally viewed and interpreted this ECG.   Date: 06/25/2016  EKG Time: 8:22 PM  Rate: 96  Rhythm: Normal sinus rhythm  Axis: Normal  Intervals: Normal  ST&T Change: None  ____________________________________________  RADIOLOGY I, Clayton N Jeannemarie Sawaya, personally viewed and evaluated these images (plain radiographs) as part of my medical decision making, as well as reviewing the written report by the radiologist.  US Abdomen Limited Ruq  Result Date: 06/25/2016 CLINICAL DATA:  Subacute onset of right upper quadrant abdominal pain, nausea and vomiting. Initial encounter. EXAM: US ABDOMEN LIMITED - RIGHT UPPER QUADRANT COMPARISON:  CT of the abdomen and pelvis from 12/13/2013 FINDINGS: Gallbladder: No gallstones or wall thickening visualized. No sonographic Murphy sign noted by sonographer. Common bile duct: Diameter: 0.6 cm, within normal limits in caliber. Liver: No focal lesion identified. Within normal limits in parenchymal  echogenicity. IMPRESSION: Unremarkable ultrasound of the right upper quadrant. Electronically Signed   By: Roanna Raider M.D.   On: 06/25/2016 01:14     Procedures      INITIAL IMPRESSION / ASSESSMENT AND PLAN / ED COURSE  Pertinent labs & imaging results that were available during my care of the patient were reviewed by me and considered in my medical decision making (see chart for details).  Patient given IV Toradol and Phenergan in the emergency department. On reexamination the patient states that all symptoms completely resolved.   Clinical Course    ____________________________________________  FINAL CLINICAL IMPRESSION(S) / ED DIAGNOSES  Final diagnoses:  Non-intractable cyclical vomiting with nausea     MEDICATIONS GIVEN DURING THIS VISIT:  Medications  ondansetron (ZOFRAN-ODT) disintegrating tablet 8 mg (8 mg Oral Given 06/24/16 2024)  ketorolac (TORADOL) 30 MG/ML injection 30 mg (30 mg Intravenous Given 06/25/16 0005)  promethazine (PHENERGAN) injection 12.5 mg (12.5 mg Intravenous Given 06/25/16 0006)  sodium chloride 0.9 % bolus 1,000 mL (0 mLs Intravenous Stopped 06/25/16 0158)     NEW OUTPATIENT MEDICATIONS STARTED DURING THIS VISIT:  Discharge Medication List as of 06/25/2016  1:39 AM      Discharge Medication List as of 06/25/2016  1:39 AM      Discharge Medication List as of 06/25/2016  1:39 AM       Note:  This document was prepared using Dragon voice recognition software and may include unintentional dictation errors.    Darci Current, MD 06/25/16 [phone]

## 2016-08-12 ENCOUNTER — Other Ambulatory Visit: Payer: Self-pay | Admitting: Family Medicine

## 2016-08-12 ENCOUNTER — Ambulatory Visit
Admission: RE | Admit: 2016-08-12 | Discharge: 2016-08-12 | Disposition: A | Discharge status: Home / Self Care | Payer: Disability Insurance | Source: Ambulatory Visit | Attending: Family Medicine | Admitting: Family Medicine

## 2016-08-12 DIAGNOSIS — M19072 Primary osteoarthritis, left ankle and foot: Secondary | ICD-10-CM | POA: Insufficient documentation

## 2016-08-12 DIAGNOSIS — M545 Low back pain: Secondary | ICD-10-CM | POA: Insufficient documentation

## 2016-08-12 DIAGNOSIS — M5136 Other intervertebral disc degeneration, lumbar region: Secondary | ICD-10-CM | POA: Insufficient documentation

## 2016-08-12 DIAGNOSIS — M19071 Primary osteoarthritis, right ankle and foot: Secondary | ICD-10-CM | POA: Diagnosis not present

## 2016-08-12 DIAGNOSIS — I708 Atherosclerosis of other arteries: Secondary | ICD-10-CM | POA: Insufficient documentation

## 2016-08-12 DIAGNOSIS — M549 Dorsalgia, unspecified: Secondary | ICD-10-CM

## 2016-08-12 DIAGNOSIS — Z8673 Personal history of transient ischemic attack (TIA), and cerebral infarction without residual deficits: Secondary | ICD-10-CM | POA: Insufficient documentation

## 2016-08-12 DIAGNOSIS — M4856XA Collapsed vertebra, not elsewhere classified, lumbar region, initial encounter for fracture: Secondary | ICD-10-CM | POA: Diagnosis not present

## 2016-08-12 DIAGNOSIS — X58XXXA Exposure to other specified factors, initial encounter: Secondary | ICD-10-CM | POA: Diagnosis not present

## 2016-08-12 DIAGNOSIS — I700 Atherosclerosis of aorta: Secondary | ICD-10-CM | POA: Insufficient documentation

## 2016-08-12 DIAGNOSIS — I10 Essential (primary) hypertension: Secondary | ICD-10-CM | POA: Insufficient documentation

## 2016-08-12 DIAGNOSIS — I519 Heart disease, unspecified: Secondary | ICD-10-CM | POA: Insufficient documentation

## 2016-10-27 ENCOUNTER — Telehealth: Payer: Self-pay | Admitting: Cardiovascular Disease

## 2016-10-27 NOTE — Telephone Encounter (Signed)
Received records request Disability Determination Services, forwarded to [employer] for processing.  

## 2016-11-17 ENCOUNTER — Telehealth: Payer: Self-pay | Admitting: Cardiovascular Disease

## 2016-11-17 NOTE — Telephone Encounter (Signed)
Received records request Disability Determination Services forwarded to [employer] for processing.  

## 2017-01-28 ENCOUNTER — Emergency Department
Admission: EM | Admit: 2017-01-28 | Discharge: 2017-01-28 | Disposition: A | Discharge status: Other Acute Inpatient Hospital | Payer: Medicaid Other | Attending: Emergency Medicine | Admitting: Emergency Medicine

## 2017-01-28 ENCOUNTER — Emergency Department: Payer: Medicaid Other

## 2017-01-28 DIAGNOSIS — Z5181 Encounter for therapeutic drug level monitoring: Secondary | ICD-10-CM | POA: Insufficient documentation

## 2017-01-28 DIAGNOSIS — I7103 Dissection of thoracoabdominal aorta: Secondary | ICD-10-CM | POA: Diagnosis not present

## 2017-01-28 DIAGNOSIS — R7989 Other specified abnormal findings of blood chemistry: Secondary | ICD-10-CM

## 2017-01-28 DIAGNOSIS — F17210 Nicotine dependence, cigarettes, uncomplicated: Secondary | ICD-10-CM | POA: Insufficient documentation

## 2017-01-28 DIAGNOSIS — R778 Other specified abnormalities of plasma proteins: Secondary | ICD-10-CM

## 2017-01-28 DIAGNOSIS — R001 Bradycardia, unspecified: Secondary | ICD-10-CM | POA: Diagnosis present

## 2017-01-28 DIAGNOSIS — I10 Essential (primary) hypertension: Secondary | ICD-10-CM | POA: Insufficient documentation

## 2017-01-28 DIAGNOSIS — I2510 Atherosclerotic heart disease of native coronary artery without angina pectoris: Secondary | ICD-10-CM | POA: Insufficient documentation

## 2017-01-28 LAB — TROPONIN I: Troponin I: 0.03 ng/mL (ref <0.03)

## 2017-01-28 LAB — ETHANOL

## 2017-01-28 LAB — CBC WITH DIFFERENTIAL/PLATELET
Basophils Absolute: 0 10*3/uL (ref 0–0.1)
Basophils Relative: 0 %
EOS ABS: 0 10*3/uL (ref 0–0.7)
EOS PCT: 0 %
HCT: 51.4 % — ABNORMAL HIGH (ref 35.0–47.0)
HEMOGLOBIN: 17.6 g/dL — AB (ref 12.0–16.0)
LYMPHS PCT: 8 %
Lymphs Abs: 0.9 10*3/uL — ABNORMAL LOW (ref 1.0–3.6)
MCH: 32.3 pg (ref 26.0–34.0)
MCHC: 34.2 g/dL (ref 32.0–36.0)
MCV: 94.5 fL (ref 80.0–100.0)
MONOS PCT: 2 %
Monocytes Absolute: 0.2 10*3/uL (ref 0.2–0.9)
NEUTROS PCT: 90 %
Neutro Abs: 11.2 10*3/uL — ABNORMAL HIGH (ref 1.4–6.5)
Platelets: 198 10*3/uL (ref 150–440)
RBC: 5.45 MIL/uL — ABNORMAL HIGH (ref 3.80–5.20)
RDW: 14.4 % (ref 11.5–14.5)
WBC: 12.3 10*3/uL — ABNORMAL HIGH (ref 3.6–11.0)

## 2017-01-28 LAB — URINALYSIS, COMPLETE (UACMP) WITH MICROSCOPIC
Bacteria, UA: NONE SEEN
Bilirubin Urine: NEGATIVE
Glucose, UA: >=500 mg/dL — AB
HGB URINE DIPSTICK: NEGATIVE
Ketones, ur: 5 mg/dL — AB
Leukocytes, UA: NEGATIVE
Nitrite: NEGATIVE
SPECIFIC GRAVITY, URINE: 1.038 — AB (ref 1.005–1.030)
pH: 7 (ref 5.0–8.0)

## 2017-01-28 LAB — APTT: aPTT: 30 seconds (ref 24–36)

## 2017-01-28 LAB — TYPE AND SCREEN
ABO/RH(D): B NEG
ANTIBODY SCREEN: NEGATIVE

## 2017-01-28 LAB — COMPREHENSIVE METABOLIC PANEL
ALK PHOS: 56 U/L (ref 38–126)
ALT: 16 U/L (ref 14–54)
AST: 29 U/L (ref 15–41)
Albumin: 4.4 g/dL (ref 3.5–5.0)
Anion gap: 11 (ref 5–15)
BILIRUBIN TOTAL: 1 mg/dL (ref 0.3–1.2)
BUN: 17 mg/dL (ref 6–20)
CALCIUM: 9.8 mg/dL (ref 8.9–10.3)
CO2: 25 mmol/L (ref 22–32)
CREATININE: 0.85 mg/dL (ref 0.44–1.00)
Chloride: 105 mmol/L (ref 101–111)
Glucose, Bld: 205 mg/dL — ABNORMAL HIGH (ref 65–99)
Potassium: 3.5 mmol/L (ref 3.5–5.1)
SODIUM: 141 mmol/L (ref 135–145)
Total Protein: 8 g/dL (ref 6.5–8.1)

## 2017-01-28 LAB — LACTIC ACID, PLASMA: Lactic Acid, Venous: 2.5 mmol/L (ref 0.5–1.9)

## 2017-01-28 LAB — PROTIME-INR
INR: 1.1
PROTHROMBIN TIME: 14.2 s (ref 11.4–15.2)

## 2017-01-28 LAB — LIPASE, BLOOD: LIPASE: 27 U/L (ref 11–51)

## 2017-01-28 MED ORDER — NICARDIPINE HCL IN NACL 20-0.86 MG/200ML-% IV SOLN
3.0000 mg/h | Freq: Once | INTRAVENOUS | Status: AC
  Administered 2017-01-28: 5 mg/h via INTRAVENOUS
  Filled 2017-01-28: qty 200

## 2017-01-28 MED ORDER — ONDANSETRON HCL 4 MG/2ML IJ SOLN
4.0000 mg | INTRAMUSCULAR | Status: AC
  Administered 2017-01-28: 4 mg via INTRAVENOUS
  Filled 2017-01-28: qty 2

## 2017-01-28 MED ORDER — IOPAMIDOL (ISOVUE-370) INJECTION 76%: 125.0000 mL | Freq: Once | INTRAVENOUS | Status: DC | PRN

## 2017-01-28 MED ORDER — ESMOLOL HCL-SODIUM CHLORIDE 2000 MG/100ML IV SOLN
25.0000 ug/kg/min | Freq: Once | INTRAVENOUS | Status: AC
  Administered 2017-01-28: 500 ug/kg/min via INTRAVENOUS
  Filled 2017-01-28: qty 100

## 2017-01-28 MED ORDER — FENTANYL CITRATE (PF) 100 MCG/2ML IJ SOLN: 50.0000 ug | Freq: Once | INTRAMUSCULAR | Status: DC

## 2017-01-28 MED ORDER — HYDROMORPHONE HCL 1 MG/ML IJ SOLN
1.0000 mg | Freq: Once | INTRAMUSCULAR | Status: AC
  Administered 2017-01-28: 1 mg via INTRAVENOUS
  Filled 2017-01-28: qty 1

## 2017-01-28 MED ORDER — SODIUM CHLORIDE 0.9 % IV SOLN
150.0000 ug/h | INTRAVENOUS | Status: DC
  Administered 2017-01-28: 150 ug/h via INTRAVENOUS
  Filled 2017-01-28: qty 50

## 2017-01-28 NOTE — ED Triage Notes (Signed)
Pt arrived to ED from home reporting vomiting since 5 last night, SOB, Severe lower abd and back pain, and bradycardia at 50 bpm. Pt is diaphoretic upon arrival. Pt is alert and oriented x 4 but restless.

## 2017-01-28 NOTE — ED Notes (Signed)
Patient transported to CT 

## 2017-01-28 NOTE — ED Provider Notes (Addendum)
[facility] Emergency Department Provider Note  ____________________________________________   Time Seen:  02:15   I have reviewed the triage vital signs and the nursing notes.   HISTORY  Chief Complaint Bradycardia    HPI [name] is a 56 y.o. female with a located medical history as listed below who presents by EMS for evaluation of acute onset severe back pain and abdominal pain with vomiting and sweating.  She has had similar episodes in the past but states this is much worse.  She is in severe distress upon arrival with a significantly elevated blood pressure around 220/120, extremely diaphoretic, and moaning and crying out in pain.  She states the pain is severe and sharp in the middle of her back as well as in her abdomen.  Nothing makes it better or worse.  She has had some vomiting as well.  She does not specifically complain of chest pain.  She is extremely anxious and has severe or phobia and needs something for pain and to help her calm down.  In the past when she has had these episodes, as well as episodes of cyclic vomiting, she has been helped with Ativan.   Past Medical History:  Diagnosis Date  . Agoraphobia   . Anxiety and depression   . CVA (cerebrovascular accident) (HCC) 03/2012  . Dyslipidemia   . History of hyperkalemia   . Hyperlipidemia   . Hypokalemia    a. 10/2012 hospitalization  . Insomnia   . Malignant hypertension    a. multiple admissions.  . Non-obstructive CAD    a. 09/2012 NSTEMI/Cath: LM nl, LAD 45m, 20d, LCX  20p/m, RCA 50p, 30m, EF 35%;  b. 10/2012 NSTEMI in setting of anxiety, n/v, htn emergency, EF 55% by echo.  . Obesity    a. previously over 400 lbs.  . Polycythemia    a. chronic; due to smoking  . Stress-induced cardiomyopathy    a. 09/2012 EF 35%;  b. 10/2012 Echo: EF >55%, mod conc LVH, nl RV.  Marland Kitchen Thrombocytopenia (HCC)    a. 10/2012 hospitalization  . Tobacco abuse    a. ongoing - prev smoking 4ppd.     Patient Active Problem List   Diagnosis Date Noted  . Hypertensive urgency 11/26/2015  . Hyponatremia 05/15/2015  . Leg pain 05/12/2013  . Tobacco abuse   . Obesity   . Thrombocytopenia (HCC)   . Hypokalemia   . Agoraphobia   . Hyperlipidemia   . Stress-induced cardiomyopathy   . Coronary artery disease   . Anxiety and depression   . Malignant hypertension   . Dyslipidemia     Past Surgical History:  Procedure Laterality Date  . CARDIAC CATHETERIZATION  09/2012   [facility]    Prior to Admission medications   Medication Sig Start Date End Date Taking? Authorizing Provider  ALPRAZolam Prudy Feeler) 1 MG tablet Take 1 mg by mouth 4 (four) times daily.    Historical Provider, MD  amLODipine (NORVASC) 10 MG tablet TAKE 1 TABLET (10 MG TOTAL) BY MOUTH DAILY. 05/11/16   Iran Ouch, MD  atomoxetine (STRATTERA) 80 MG capsule Take 80 mg by mouth daily.    Historical Provider, MD  butalbital-acetaminophen-caffeine (FIORICET) [phone] MG tablet Take 1-2 tablets by mouth every 6 (six) hours as needed for headache. 06/22/16 06/22/17  Rebecka Apley, MD  carvedilol (COREG) 12.5 MG tablet TAKE 1 TABLET BY MOUTH TWICE DAILY WITH A MEAL 01/07/16   Ok Anis, NP  cyclobenzaprine (FLEXERIL)  10 MG tablet Take 10 mg by mouth 3 (three) times daily as needed.    Historical Provider, MD  gabapentin (NEURONTIN) 300 MG capsule Take 300 mg by mouth 3 (three) times daily.    Historical Provider, MD  hydrOXYzine (ATARAX/VISTARIL) 10 MG tablet Take 1 tablet (10 mg total) by mouth 3 (three) times daily as needed. 06/22/16   Rebecka Apley, MD  lisinopril (PRINIVIL,ZESTRIL) 10 MG tablet Take 1 tablet (10 mg total) by mouth 2 (two) times daily. 01/07/16   Ok Anis, NP  metoCLOPramide (REGLAN) 10 MG tablet Take 1 tablet (10 mg total) by mouth every 8 (eight) hours as needed. 06/22/16   Rebecka Apley, MD  pantoprazole (PROTONIX) 40 MG tablet Take 40 mg by mouth daily.    Historical Provider, MD   QUEtiapine (SEROQUEL) 200 MG tablet Take 200 mg by mouth at bedtime.    Historical Provider, MD  sertraline (ZOLOFT) 100 MG tablet Take 100 mg by mouth daily.    Historical Provider, MD  simvastatin (ZOCOR) 20 MG tablet Take 1 tablet (20 mg total) by mouth at bedtime. 12/26/15   Ok Anis, NP  traMADol (ULTRAM) 50 MG tablet Take 50 mg by mouth 2 (two) times daily.    Historical Provider, MD    Allergies Patient has no known allergies.  Family History  Problem Relation Age of Onset  . Family history unknown: Yes    Social History Social History  Substance Use Topics  . Smoking status: Current Every Day Smoker    Packs/day: 1.00    Years: 30.00    Types: Cigarettes  . Smokeless tobacco: Never Used  . Alcohol use No    Review of Systems Constitutional: No fever/chills Eyes: No visual changes. ENT: No sore throat. Cardiovascular: Denies chest pain.  Severe pain in the middle of her back radiating to her abdomen Respiratory: Denies shortness of breath. Gastrointestinal: Severe abdominal pain with vomiting and nausea Genitourinary: Negative for dysuria. Musculoskeletal: Severe pain in middle of back Skin: Negative for rash. Neurological: Negative for headaches, focal weakness or numbness.  10-point ROS otherwise negative.  ____________________________________________   PHYSICAL EXAM:  VITAL SIGNS: ED Triage Vitals  Enc Vitals Group     BP 01/28/17 0218 (!) 209/122     Pulse Rate 01/28/17 0218 (!) 43     Resp 01/28/17 0218 18     Temp 01/28/17 0218 97.9 F (36.6 C)     Temp Source 01/28/17 0218 Oral     SpO2 01/28/17 0218 99 %     Weight 01/28/17 0219 240 lb (108.9 kg)     Height --      Head Circumference --      Peak Flow --      Pain Score 01/28/17 0218 10     Pain Loc --      Pain Edu? --      Excl. in GC? --     Constitutional: Alert and oriented but in severe distress, diaphoretic, crying out in pain Eyes: Conjunctivae are normal. PERRL.  EOMI. Head: Atraumatic. Nose: No congestion/rhinnorhea. Mouth/Throat: Mucous membranes are moist. Neck: No stridor.  No meningeal signs.   Cardiovascular: Bradycardia in the 40s-50s, regular rhythm. Grossly normal heart sounds. Respiratory: Increased respiratory effort due to pain.  No retractions. Lungs CTAB. Gastrointestinal: Obese.  Soft with severe tenderness to palpation throughout. Musculoskeletal: No lower extremity tenderness nor edema. No gross deformities of extremities. Neurologic:  Normal speech and language. No gross focal  neurologic deficits are appreciated.  Skin:  Skin is cool, diaphoretic and intact. Scattered petechiae on feet and legs   ____________________________________________   LABS (all labs ordered are listed, but only abnormal results are displayed)  Labs Reviewed  COMPREHENSIVE METABOLIC PANEL - Abnormal; Notable for the following:       Result Value   Glucose, Bld 205 (*)    All other components within normal limits  TROPONIN I - Abnormal; Notable for the following:    Troponin I 0.03 (*)    All other components within normal limits  LACTIC ACID, PLASMA - Abnormal; Notable for the following:    Lactic Acid, Venous 2.5 (*)    All other components within normal limits  CBC WITH DIFFERENTIAL/PLATELET - Abnormal; Notable for the following:    WBC 12.3 (*)    RBC 5.45 (*)    Hemoglobin 17.6 (*)    HCT 51.4 (*)    Neutro Abs 11.2 (*)    Lymphs Abs 0.9 (*)    All other components within normal limits  ETHANOL  LIPASE, BLOOD  PROTIME-INR  APTT  LACTIC ACID, PLASMA  URINALYSIS, COMPLETE (UACMP) WITH MICROSCOPIC  TYPE AND SCREEN   ____________________________________________  EKG  ED ECG REPORT I, Clementina Mareno, the attending physician, personally viewed and interpreted this ECG.  Date: 01/28/2017 EKG Time: 2:18 AM Rate: 57 Rhythm: Sinus bradycardia QRS Axis: normal Intervals: normal ST/T Wave abnormalities: globally inverted T waves, no ST  segment elevation or depression Conduction Disturbances: none Narrative Interpretation: Does not meet STEMI criteria, may or may not indicate acute ischemia  ____________________________________________  RADIOLOGY   Ct Angio Chest/abd/pel For Dissection W And/or W/wo  Result Date: 01/28/2017 CLINICAL DATA:  Acute onset of severe lower abdominal and back pain. Bradycardia. Diaphoresis and vomiting. Initial encounter. EXAM: CT ANGIOGRAPHY CHEST, ABDOMEN AND PELVIS TECHNIQUE: Multidetector CT imaging through the chest, abdomen and pelvis was performed using the standard protocol during bolus administration of intravenous contrast. Multiplanar reconstructed images and MIPs were obtained and reviewed to evaluate the vascular anatomy. CONTRAST:  125 mL of Isovue 370 IV contrast COMPARISON:  CT of the abdomen and pelvis performed 12/13/2013, and chest radiograph performed 06/22/2016 FINDINGS: CTA CHEST FINDINGS Cardiovascular: Aortic dissection is noted arising about the level of the aortic arch, likely just distal to the left subclavian artery, reflecting a Stanford type B dissection. However, there is associated partially contained hemorrhage tracking about the aortic arch and proximal descending thoracic aorta, extending superiorly along the great vessels. There is minimal infiltration of blood into the adjacent pericardium. Partially contained hemorrhage extends along the descending thoracic aorta. Aside from hemorrhage tracking along the great vessels, the great vessels appear intact. Mediastinum/Nodes: The heart is otherwise normal in size. No mediastinal lymphadenopathy is seen. No significant pericardial effusion is seen. The thyroid gland is not well characterized. No axillary lymphadenopathy is appreciated. Lungs/Pleura: Minimal bibasilar atelectasis or scarring is noted. No pleural effusion or pneumothorax is seen. No masses are identified. Musculoskeletal: No acute osseous abnormalities are  identified. The visualized musculature is unremarkable in appearance. Review of the MIP images confirms the above findings. CTA ABDOMEN AND PELVIS FINDINGS VASCULAR Aorta: The true lumen is smaller than the false lumen along the descending thoracic aorta, and is seen primarily posteriorly along the abdominal aorta. Enhancement of the false lumen markedly decreases more distally. Celiac: There is extension of the dissection flap into the celiac trunk, and into the common hepatic artery and splenic artery. There is resultant marked  narrowing of the proximal splenic artery, and mild narrowing of the common hepatic artery. Soft tissue hemorrhage is seen tracking about the pancreas and superiorly about the gastric fundus, reflecting extraluminal hemorrhage partially contained by surrounding structures. SMA: The dissection flap extends slightly into the superior mesenteric artery. However, the superior mesenteric artery appears to be supplied by the false lumen, and demonstrates only minimal enhancement. Renals: The dissection flap extends into the right renal artery, with associated focal narrowing. The left renal artery appears intact, with minimal calcification. IMA: The inferior mesenteric artery arises from the false lumen, and demonstrates mildly decreased enhancement. Inflow: The dissection flap extends into the common iliac arteries bilaterally, though it causes only mild narrowing on the left. The distal left common iliac artery is supplied by the true lumen. On the right, the true lumen of the right common iliac artery is significantly effaced; the right external iliac artery is supplied by the false lumen, while the right internal iliac artery is supplied by the true lumen. This results in non-opacification of the right common femoral artery, though there is reconstitution of the branches of the common femoral artery, likely secondary to the circumflex iliac artery. Veins: Visualized venous structures are  grossly unremarkable, though not well assessed given the phase of contrast enhancement. There significant narrowing of the inferior vena cava, likely reflecting volume depletion. Review of the MIP images confirms the above findings. NON-VASCULAR Hepatobiliary: The liver is unremarkable in appearance. The gallbladder is unremarkable in appearance. The common bile duct remains normal in caliber. Pancreas: As described above, there is some degree of hemorrhage about the distal body and tail the pancreas, tracking about the left adrenal gland and gastric fundus. The pancreas is otherwise grossly unremarkable. Spleen: The spleen is unremarkable in appearance. Adrenals/Urinary Tract: The adrenal glands are otherwise unremarkable in appearance. Mild bilateral renal atrophy is noted. No hydronephrosis is seen. No renal or ureteral stones are identified. No perinephric stranding is appreciated. Stomach/Bowel: The stomach is unremarkable in appearance. The small bowel is within normal limits. The appendix is normal in caliber, without evidence of appendicitis. Scattered diverticulosis is noted along the descending and proximal sigmoid colon, without evidence of diverticulitis. Lymphatic: No retroperitoneal lymphadenopathy is seen. No pelvic sidewall lymphadenopathy is identified. Reproductive: The bladder is minimally distended and grossly unremarkable. The uterus is unremarkable in appearance. The ovaries are relatively symmetric. No suspicious adnexal masses are seen. Other: No additional soft tissue abnormalities are seen. Musculoskeletal: No acute osseous abnormalities are identified. Facet disease is noted along the lumbar spine. The visualized musculature is unremarkable in appearance. Review of the MIP images confirms the above findings. IMPRESSION: 1. Acute aortic dissection arising at the level of the aortic arch, likely just distal to the left subclavian artery, reflecting a Stanford type B dissection. However,  associated partially contained hemorrhage tracks about the aortic arch and descending thoracic aorta, and hemorrhage also extends superiorly around the great vessels. Minimal infiltration of blood into the adjacent pericardium. 2. Aortic dissection extends into the celiac trunk and common iliac arteries bilaterally, and minimally into the superior mesenteric artery and right renal artery. 3. Extension of the dissection into the celiac trunk results in marked narrowing of the proximal splenic artery and mild narrowing of the common hepatic artery. Associated extraluminal hemorrhage noted tracking about the pancreas and left adrenal gland, and extending superiorly about the gastric fundus, partially contained by surrounding structures. 4. The superior mesenteric artery is supplied by the false lumen. The inferior mesenteric artery  is also supplied by the false lumen. 5. Focal narrowing of the proximal right renal artery due to minimal involvement from the underlying dissection. Mild narrowing of the proximal left common iliac artery due to minimal involvement from the underlying dissection. 6. Marked effacement of the true lumen of the right common iliac artery. The right external iliac artery is supplied by the false lumen, while the right internal iliac artery is supplied by the true lumen. Non-enhancement of the right common femoral artery, though it reconstitutes at the level of the branches of the common femoral artery, likely secondary to the circumflex iliac artery. 7. Significant narrowing of the inferior vena cava, likely reflecting volume depletion. 8. Mild bilateral renal atrophy. 9. Scattered diverticulosis along the descending and proximal sigmoid colon, without evidence of diverticulitis. Critical Value/emergent results were called by telephone at the time of interpretation on 01/28/2017 at 3:04 am to Dr. Loleta Rose, who verbally acknowledged these results. Electronically Signed   By: Roanna Raider  M.D.   On: 01/28/2017 03:37    ____________________________________________   PROCEDURES  Critical Care performed: Yes, see critical care procedure note(s)   Procedure(s) performed:     .Critical Care Performed by: Loleta Rose Authorized by: Loleta Rose   Critical care provider statement:    Critical care time (minutes):  75   Critical care time was exclusive of:  Separately billable procedures and treating other patients   Critical care was necessary to treat or prevent imminent or life-threatening deterioration of the following conditions:  Circulatory failure   Critical care was time spent personally by me on the following activities:  Development of treatment plan with patient or surrogate, discussions with consultants, evaluation of patient's response to treatment, examination of patient, obtaining history from patient or surrogate, ordering and performing treatments and interventions, ordering and review of laboratory studies, ordering and review of radiographic studies, pulse oximetry, re-evaluation of patient's condition and review of old charts       ____________________________________________   INITIAL IMPRESSION / ASSESSMENT AND PLAN / ED COURSE  Pertinent labs & imaging results that were available during my care of the patient were reviewed by me and considered in my medical decision making (see chart for details).   Clinical Course as of Jan 28 421  Thu Jan 28, 2017  0225 The patient presents very acutely and seems to be in severe distress.  She is hypertensive at 209/122, diaphoretic, and yelling about severe pain in her back, chest, and abdomen.  I must rule out aortic dissection, although looking back through her record I see she has had similar presentations in the past that were attributed to cyclic vomiting syndrome and anxiety and she felt completely better after Haldol and Toradol.  However her vital signs are abnormal at this time with bradycardia in  the 40s and significant hypertension.  I will treat with one dose of Dilaudid and Zofran while attempting to rule out the acute, emergent medical conditions.  I will then reassess Ace on those results and the results of her lab work.  I sent the expectations with her that if this is more related to her agoraphobia and anxiety, that we will not be doing additional pain medications and will instead try to treat the other issues.  [CF]  [id number] Aortic dissection - viewed myself on the CT images.  Spoke briefly with Dr. Wyn Quaker who recommended transfer to [facility].  First I called [facility] to get the specific radiographic interpretation before I call  [facility].    Patient remains bradycardiac in spite of persistent hypertension (systolic 233).  Ordering nicardipine to control blood pressure, holding off on esmolol since the patient is already bradycardic.  [CF]  0305 Spoke by phone with radiology (Dr. Cherly Hensen) who confirmed dissection from aortic arch to iliacs. Calling [facility].  [CF]  [phone] Patient still in severe pain, now with severe pain in right leg and trouble moving it.  Ordering fentanyl 50 mcg bolus followed by 150 mcg/hr infusion.  Speaking at this moment with [facility] transfer center for emergent transfer to Vascular Surgery.  [CF]  0321 Spoke by phone with Vascular fellow who accepted the patient to Dr. Danae Orleans service.  Recommended going ahead and starting esmolol drip for BP control in spite of bradycardia, although HR is now in the 90s.  Requested air transfer, awaiting bed assignment and weather check. Updated patient and daughter about the grave prognosis.    [CF]  0339 BP still elevated at about 185/110, increased nicardipine to max dose of 15mg /hr, and esmolol is starting according to protocol (500 mcg/kg over 1 min, then 50 mcg/kg/min infusion).  [CF]  [phone] [facility] arriving shortly for transport.  Still titrating blood pressure meds  [CF]  0357 BP now 128/90.  Maintaining current infusions.  [CF]  0421 [facility] is loading patient now for transport.  Stable, AOx3, still reporting pain throughout but primarily in her back.  [CF]    Clinical Course User Index [CF] Loleta Rose, MD    ____________________________________________  FINAL CLINICAL IMPRESSION(S) / ED DIAGNOSES  Final diagnoses:  Aortic dissection, thoracoabdominal (HCC)  Malignant hypertension  Elevated troponin I level     MEDICATIONS GIVEN DURING THIS VISIT:  Medications  iopamidol (ISOVUE-370) 76 % injection 125 mL (not administered)  fentaNYL (SUBLIMAZE) 2,500 mcg in sodium chloride 0.9 % 250 mL (10 mcg/mL) infusion (150 mcg/hr Intravenous New Bag/Given 01/28/17 0324)  fentaNYL (SUBLIMAZE) injection 50 mcg (50 mcg Intravenous Not Given 01/28/17 0359)  ondansetron (ZOFRAN) injection 4 mg (4 mg Intravenous Given 01/28/17 0227)  HYDROmorphone (DILAUDID) injection 1 mg (1 mg Intravenous Given 01/28/17 0227)  nicardipine (CARDENE) 20mg  in 0.86% saline IV infusion (0.1 mg/ml) (15 mg/hr Intravenous Rate/Dose Change 01/28/17 0347)  esmolol (BREVIBLOC) 2000 mg / 100 mL (20 mg/mL) infusion (50 mcg/kg/min  108.9 kg Intravenous Rate/Dose Change 01/28/17 0347)     NEW OUTPATIENT MEDICATIONS STARTED DURING THIS VISIT:  New Prescriptions   No medications on file    Modified Medications   No medications on file    Discontinued Medications   No medications on file     Note:  This document was prepared using Dragon voice recognition software and may include unintentional dictation errors.    Loleta Rose, MD 01/28/17 0422    Loleta Rose, MD 01/28/17 [phone]

## 2017-02-16 DEATH — deceased

## 2018-04-11 ENCOUNTER — Telehealth: Payer: Self-pay | Admitting: Cardiovascular Disease

## 2018-04-11 NOTE — Telephone Encounter (Signed)
Received request for medical records via mail from [employer] [facility]   Sent interoffice mail  To   [place]
